# Patient Record
Sex: Male | Born: 1965 | Race: White | Hispanic: No | Marital: Married | Smoking: Never smoker
Health system: Southern US, Community
[De-identification: ages and names within clinical notes are randomized; demographics above are authoritative.]

## PROBLEM LIST (undated history)

## (undated) DIAGNOSIS — K219 Gastro-esophageal reflux disease without esophagitis: Secondary | ICD-10-CM

## (undated) DIAGNOSIS — E079 Disorder of thyroid, unspecified: Secondary | ICD-10-CM

## (undated) DIAGNOSIS — N200 Calculus of kidney: Secondary | ICD-10-CM

## (undated) DIAGNOSIS — I10 Essential (primary) hypertension: Secondary | ICD-10-CM

## (undated) DIAGNOSIS — IMO0002 Reserved for concepts with insufficient information to code with codable children: Secondary | ICD-10-CM

## (undated) DIAGNOSIS — Z87442 Personal history of urinary calculi: Secondary | ICD-10-CM

## (undated) DIAGNOSIS — Z789 Other specified health status: Secondary | ICD-10-CM

## (undated) DIAGNOSIS — K922 Gastrointestinal hemorrhage, unspecified: Secondary | ICD-10-CM

## (undated) HISTORY — DX: Disorder of thyroid, unspecified: E07.9

## (undated) HISTORY — DX: Calculus of kidney: N20.0

## (undated) HISTORY — PX: SHOULDER SURGERY: SHX246

## (undated) HISTORY — DX: Gastro-esophageal reflux disease without esophagitis: K21.9

## (undated) HISTORY — PX: OTHER SURGICAL HISTORY: SHX169

## (undated) HISTORY — PX: WISDOM TOOTH EXTRACTION: SHX21

## (undated) HISTORY — DX: Essential (primary) hypertension: I10

---

## 2013-08-03 ENCOUNTER — Other Ambulatory Visit: Payer: Self-pay | Admitting: Orthopedic Surgery

## 2013-08-03 DIAGNOSIS — M25511 Pain in right shoulder: Secondary | ICD-10-CM

## 2013-08-10 ENCOUNTER — Ambulatory Visit
Admission: RE | Admit: 2013-08-10 | Discharge: 2013-08-10 | Disposition: A | Discharge status: Home / Self Care | Payer: BC Managed Care – PPO | Source: Ambulatory Visit | Attending: Orthopedic Surgery | Admitting: Orthopedic Surgery

## 2013-08-10 DIAGNOSIS — M25511 Pain in right shoulder: Secondary | ICD-10-CM

## 2013-09-29 ENCOUNTER — Other Ambulatory Visit: Payer: Self-pay | Admitting: Orthopedic Surgery

## 2013-09-30 ENCOUNTER — Encounter (HOSPITAL_BASED_OUTPATIENT_CLINIC_OR_DEPARTMENT_OTHER): Payer: Self-pay | Admitting: *Deleted

## 2013-09-30 NOTE — Progress Notes (Signed)
No health problems No labs needed Never smoked

## 2013-10-07 NOTE — H&P (Signed)
Stephen Baker is an 47 y.o. male.   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  He presents for evaluation of a 12 month history of right shoulder pain.  His pain is perceived anteriorly near the subscapularis insertion and long head of biceps.  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  He had no history of fracture or dislocation to the right or left shoulders.  He reports that his right shoulder is weak in abduction, scaption and internal rotation.  He can lift overhead well.  He does not have chronic night pain. He denies numbness.  He has no symptoms on the left.  He has no neck discomfort.     Past Medical History  Diagnosis Date  . Medical history non-contributory   . Personal history of kidney stones     Past Surgical History  Procedure Laterality Date  . Wisdom tooth extraction      History reviewed. No pertinent family history. Social History:  reports that he has never smoked. He does not have any smokeless tobacco history on file. He reports that he does not drink alcohol or use illicit drugs.  Allergies: No Known Allergies  No prescriptions prior to admission    No results found for this or any previous visit (from the past 48 hour(s)).  No results found.   Pertinent items are noted in HPI.  Height 5\' 8"  (1.727 m), weight 76.204 kg (168 lb).  General appearance: alert Head: Normocephalic, without obvious abnormality Neck: supple, symmetrical, trachea midline Resp: clear to auscultation bilaterally Cardio: regular rate and rhythm GI: normal findings: bowel sounds normal Extremities: He has normal musculature. There is no atrophy of the periscapular muscles or deltoid.  He has range of motion elevation 170 symmetrical bilaterally abduction 90 degrees external rotation 90 right, 90 left.  Internal  rotation right thumb to T-12, left T-8.  He can extend his shoulder to the interscapular plane right and left.  He has tenderness on direct palpation over the long head of the biceps and lesser tuberosity on the right, negative on the left.  He has 5/5 strength in forward flexion, abduction and scaption until full internal rotation is applied at which point he has some measurable weakness graded 5-/5 on the right.  He has a painful push-off test on the right, negative on the left.  He has a painful Speed's test on the right.   Plain x-rays of his shoulder demonstrate degenerative change at the [facility] joint and minimal sclerosis of the greater tuberosity, glenohumeral joint is well preserved.   MRI of his right shoulder.  Stephen Baker has a significant posterior labral tear between 7 and 9 o'clock.  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  He has moderate AC joint arthritis and subacromial bursitis.     Pulses: 2+ and symmetric Skin: normal Neurologic: Grossly normal    Assessment/Plan Impression:  Right shoulder impingement with AC arthrosis and labral tear.  Plan:To the OR for right shoulder scope with SAD/DCR and repair labrum and RC as needed.The procedure, risks,benefits and post-op course were discussed with the patient at length and they were in agreement with the plan.  DASNOIT,Stephen Baker 10/07/2013, 3:13 PM  H&P documentation: 10/08/2013  -History and Physical Reviewed  -Patient has been re-examined  -No change in the plan of  care  Wyn Forster, MD

## 2013-10-08 ENCOUNTER — Encounter (HOSPITAL_BASED_OUTPATIENT_CLINIC_OR_DEPARTMENT_OTHER)
Admission: RE | Disposition: A | Discharge status: Home / Self Care | Payer: Self-pay | Source: Ambulatory Visit | Attending: Orthopedic Surgery

## 2013-10-08 ENCOUNTER — Encounter (HOSPITAL_BASED_OUTPATIENT_CLINIC_OR_DEPARTMENT_OTHER): Payer: Self-pay | Admitting: Orthopedic Surgery

## 2013-10-08 ENCOUNTER — Ambulatory Visit (HOSPITAL_BASED_OUTPATIENT_CLINIC_OR_DEPARTMENT_OTHER): Payer: BC Managed Care – PPO | Admitting: Anesthesiology

## 2013-10-08 ENCOUNTER — Encounter (HOSPITAL_BASED_OUTPATIENT_CLINIC_OR_DEPARTMENT_OTHER): Payer: BC Managed Care – PPO | Admitting: Anesthesiology

## 2013-10-08 ENCOUNTER — Ambulatory Visit (HOSPITAL_BASED_OUTPATIENT_CLINIC_OR_DEPARTMENT_OTHER)
Admission: RE | Admit: 2013-10-08 | Discharge: 2013-10-08 | Disposition: A | Discharge status: Home / Self Care | Payer: BC Managed Care – PPO | Source: Ambulatory Visit | Attending: Orthopedic Surgery | Admitting: Orthopedic Surgery

## 2013-10-08 DIAGNOSIS — M19019 Primary osteoarthritis, unspecified shoulder: Secondary | ICD-10-CM | POA: Insufficient documentation

## 2013-10-08 DIAGNOSIS — M25819 Other specified joint disorders, unspecified shoulder: Secondary | ICD-10-CM | POA: Insufficient documentation

## 2013-10-08 DIAGNOSIS — M24119 Other articular cartilage disorders, unspecified shoulder: Secondary | ICD-10-CM | POA: Insufficient documentation

## 2013-10-08 HISTORY — DX: Other specified health status: Z78.9

## 2013-10-08 HISTORY — PX: SHOULDER ARTHROSCOPY WITH ROTATOR CUFF REPAIR AND SUBACROMIAL DECOMPRESSION: SHX5686

## 2013-10-08 HISTORY — DX: Personal history of urinary calculi: Z87.442

## 2013-10-08 LAB — POCT HEMOGLOBIN-HEMACUE: Hemoglobin: 17 g/dL (ref 13.0–17.0)

## 2013-10-08 SURGERY — SHOULDER ARTHROSCOPY WITH ROTATOR CUFF REPAIR AND SUBACROMIAL DECOMPRESSION
Anesthesia: General | Site: Shoulder | Laterality: Right

## 2013-10-08 MED ORDER — LACTATED RINGERS IV SOLN
INTRAVENOUS | Status: DC
  Administered 2013-10-08: 09:00:00 via INTRAVENOUS

## 2013-10-08 MED ORDER — SUCCINYLCHOLINE CHLORIDE 20 MG/ML IJ SOLN
INTRAMUSCULAR | Status: DC | PRN
  Administered 2013-10-08: 100 mg via INTRAVENOUS

## 2013-10-08 MED ORDER — CHLORHEXIDINE GLUCONATE 4 % EX LIQD: 60.0000 mL | Freq: Once | CUTANEOUS | Status: DC

## 2013-10-08 MED ORDER — OXYCODONE HCL 5 MG/5ML PO SOLN: 5.0000 mg | Freq: Once | ORAL | Status: DC | PRN

## 2013-10-08 MED ORDER — HYDROMORPHONE HCL 2 MG PO TABS: 2.0000 mg | ORAL_TABLET | ORAL | Status: DC | PRN

## 2013-10-08 MED ORDER — FENTANYL CITRATE 0.05 MG/ML IJ SOLN
INTRAMUSCULAR | Status: AC
  Filled 2013-10-08: qty 4

## 2013-10-08 MED ORDER — PROPOFOL 10 MG/ML IV BOLUS
INTRAVENOUS | Status: DC | PRN
  Administered 2013-10-08: 200 mg via INTRAVENOUS

## 2013-10-08 MED ORDER — DEXAMETHASONE SODIUM PHOSPHATE 10 MG/ML IJ SOLN
INTRAMUSCULAR | Status: DC | PRN
  Administered 2013-10-08: 10 mg

## 2013-10-08 MED ORDER — OXYCODONE HCL 5 MG PO TABS: 5.0000 mg | ORAL_TABLET | Freq: Once | ORAL | Status: DC | PRN

## 2013-10-08 MED ORDER — ONDANSETRON HCL 4 MG/2ML IJ SOLN: 4.0000 mg | Freq: Once | INTRAMUSCULAR | Status: DC | PRN

## 2013-10-08 MED ORDER — CEFAZOLIN SODIUM 1-5 GM-% IV SOLN
INTRAVENOUS | Status: AC
Start: 2013-10-08 — End: 2013-10-08
  Filled 2013-10-08: qty 100

## 2013-10-08 MED ORDER — LACTATED RINGERS IV SOLN
INTRAVENOUS | Status: DC | PRN
  Administered 2013-10-08 (×2): via INTRAVENOUS

## 2013-10-08 MED ORDER — DEXAMETHASONE SODIUM PHOSPHATE 4 MG/ML IJ SOLN
INTRAMUSCULAR | Status: DC | PRN
  Administered 2013-10-08: 10 mg via INTRAVENOUS

## 2013-10-08 MED ORDER — FENTANYL CITRATE 0.05 MG/ML IJ SOLN
50.0000 ug | INTRAMUSCULAR | Status: DC | PRN
  Administered 2013-10-08: 100 ug via INTRAVENOUS

## 2013-10-08 MED ORDER — FENTANYL CITRATE 0.05 MG/ML IJ SOLN
INTRAMUSCULAR | Status: AC
  Filled 2013-10-08: qty 2

## 2013-10-08 MED ORDER — HYDROMORPHONE HCL PF 1 MG/ML IJ SOLN: 0.2500 mg | INTRAMUSCULAR | Status: DC | PRN

## 2013-10-08 MED ORDER — ROPIVACAINE HCL 5 MG/ML IJ SOLN
INTRAMUSCULAR | Status: DC | PRN
  Administered 2013-10-08: 25 mL via PERINEURAL

## 2013-10-08 MED ORDER — CEPHALEXIN 500 MG PO CAPS: 500.0000 mg | ORAL_CAPSULE | Freq: Three times a day (TID) | ORAL | Status: DC

## 2013-10-08 MED ORDER — MIDAZOLAM HCL 2 MG/2ML IJ SOLN
INTRAMUSCULAR | Status: AC
  Filled 2013-10-08: qty 2

## 2013-10-08 MED ORDER — CEFAZOLIN SODIUM-DEXTROSE 2-3 GM-% IV SOLR
INTRAVENOUS | Status: DC | PRN
  Administered 2013-10-08: 2 g via INTRAVENOUS

## 2013-10-08 MED ORDER — SODIUM CHLORIDE 0.9 % IR SOLN
Status: DC | PRN
  Administered 2013-10-08: 8000 mL

## 2013-10-08 MED ORDER — EPINEPHRINE HCL 1 MG/ML IJ SOLN
INTRAMUSCULAR | Status: DC | PRN
  Administered 2013-10-08: .15 mL via SUBCUTANEOUS

## 2013-10-08 MED ORDER — MIDAZOLAM HCL 2 MG/2ML IJ SOLN
1.0000 mg | INTRAMUSCULAR | Status: DC | PRN
  Administered 2013-10-08: 2 mg via INTRAVENOUS

## 2013-10-08 SURGICAL SUPPLY — 77 items
BANDAGE ADHESIVE 1X3 (GAUZE/BANDAGES/DRESSINGS) IMPLANT
BLADE AVERAGE 25X9 (BLADE) IMPLANT
BLADE CUTTER MENIS 5.5 (BLADE) ×2 IMPLANT
BLADE SURG 15 STRL LF DISP TIS (BLADE) ×2 IMPLANT
BLADE SURG 15 STRL SS (BLADE) ×2
BUR EGG 3PK/BX (BURR) IMPLANT
BUR OVAL 6.0 (BURR) ×2 IMPLANT
CANISTER SUCT 3000ML (MISCELLANEOUS) IMPLANT
CANISTER SUCT LVC 12 LTR MEDI- (MISCELLANEOUS) ×4 IMPLANT
CANNULA TWIST IN 8.25X7CM (CANNULA) ×2 IMPLANT
CLEANER CAUTERY TIP 5X5 PAD (MISCELLANEOUS) IMPLANT
CUTTER MENISCUS  4.2MM (BLADE) ×1
CUTTER MENISCUS 4.2MM (BLADE) ×1 IMPLANT
DECANTER SPIKE VIAL GLASS SM (MISCELLANEOUS) IMPLANT
DRAPE INCISE IOBAN 66X45 STRL (DRAPES) ×2 IMPLANT
DRAPE STERI 35X30 U-POUCH (DRAPES) ×2 IMPLANT
DRAPE SURG 17X23 STRL (DRAPES) ×2 IMPLANT
DRAPE U-SHAPE 47X51 STRL (DRAPES) ×2 IMPLANT
DRAPE U-SHAPE 76X120 STRL (DRAPES) ×4 IMPLANT
DURAPREP 26ML APPLICATOR (WOUND CARE) ×2 IMPLANT
ELECT REM PT RETURN 9FT ADLT (ELECTROSURGICAL)
ELECTRODE REM PT RTRN 9FT ADLT (ELECTROSURGICAL) IMPLANT
GLOVE BIOGEL M STRL SZ7.5 (GLOVE) ×2 IMPLANT
GLOVE BIOGEL PI IND STRL 7.0 (GLOVE) ×1 IMPLANT
GLOVE BIOGEL PI IND STRL 8 (GLOVE) ×2 IMPLANT
GLOVE BIOGEL PI INDICATOR 7.0 (GLOVE) ×1
GLOVE BIOGEL PI INDICATOR 8 (GLOVE) ×2
GLOVE ECLIPSE 6.5 STRL STRAW (GLOVE) ×2 IMPLANT
GLOVE EXAM NITRILE LRG STRL (GLOVE) ×2 IMPLANT
GLOVE ORTHO TXT STRL SZ7.5 (GLOVE) ×2 IMPLANT
GOWN BRE IMP PREV XXLGXLNG (GOWN DISPOSABLE) ×6 IMPLANT
GOWN STRL REIN 2XL XLG LVL4 (GOWN DISPOSABLE) ×2 IMPLANT
NDL SUT 6 .5 CRC .975X.05 MAYO (NEEDLE) IMPLANT
NEEDLE MAYO TAPER (NEEDLE)
NEEDLE MINI RC 24MM (NEEDLE) IMPLANT
NEEDLE SCORPION (NEEDLE) ×2 IMPLANT
PACK ARTHROSCOPY DSU (CUSTOM PROCEDURE TRAY) ×2 IMPLANT
PACK BASIN DAY SURGERY FS (CUSTOM PROCEDURE TRAY) ×2 IMPLANT
PAD ABD 8X10 STRL (GAUZE/BANDAGES/DRESSINGS) ×2 IMPLANT
PAD CLEANER CAUTERY TIP 5X5 (MISCELLANEOUS)
PASSER SUT SWANSON 36MM LOOP (INSTRUMENTS) IMPLANT
PENCIL BUTTON HOLSTER BLD 10FT (ELECTRODE) IMPLANT
SLEEVE SCD COMPRESS KNEE MED (MISCELLANEOUS) ×2 IMPLANT
SLING ARM FOAM STRAP LRG (SOFTGOODS) IMPLANT
SLING ARM FOAM STRAP MED (SOFTGOODS) IMPLANT
SLING ARM FOAM STRAP XLG (SOFTGOODS) ×2 IMPLANT
SPONGE GAUZE 4X4 12PLY (GAUZE/BANDAGES/DRESSINGS) ×2 IMPLANT
SPONGE LAP 4X18 X RAY DECT (DISPOSABLE) IMPLANT
STRIP CLOSURE SKIN 1/2X4 (GAUZE/BANDAGES/DRESSINGS) ×2 IMPLANT
SUCTION FRAZIER TIP 10 FR DISP (SUCTIONS) IMPLANT
SUT ETHIBOND 2 OS 4 DA (SUTURE) IMPLANT
SUT ETHILON 4 0 PS 2 18 (SUTURE) IMPLANT
SUT FIBERWIRE #2 38 T-5 BLUE (SUTURE)
SUT FIBERWIRE 3-0 18 TAPR NDL (SUTURE)
SUT PROLENE 1 CT (SUTURE) IMPLANT
SUT PROLENE 3 0 PS 2 (SUTURE) ×2 IMPLANT
SUT TIGER TAPE 7 IN WHITE (SUTURE) IMPLANT
SUT VIC AB 0 CT1 27 (SUTURE)
SUT VIC AB 0 CT1 27XBRD ANBCTR (SUTURE) IMPLANT
SUT VIC AB 0 SH 27 (SUTURE) IMPLANT
SUT VIC AB 2-0 SH 27 (SUTURE)
SUT VIC AB 2-0 SH 27XBRD (SUTURE) IMPLANT
SUT VIC AB 3-0 SH 27 (SUTURE)
SUT VIC AB 3-0 SH 27X BRD (SUTURE) IMPLANT
SUT VIC AB 3-0 X1 27 (SUTURE) IMPLANT
SUTURE FIBERWR #2 38 T-5 BLUE (SUTURE) IMPLANT
SUTURE FIBERWR 3-0 18 TAPR NDL (SUTURE) IMPLANT
SYR 3ML 23GX1 SAFETY (SYRINGE) IMPLANT
SYR BULB 3OZ (MISCELLANEOUS) IMPLANT
TAPE FIBER 2MM 7IN #2 BLUE (SUTURE) IMPLANT
TAPE PAPER 3X10 WHT MICROPORE (GAUZE/BANDAGES/DRESSINGS) ×2 IMPLANT
TOWEL OR 17X24 6PK STRL BLUE (TOWEL DISPOSABLE) ×2 IMPLANT
TUBE CONNECTING 20X1/4 (TUBING) ×4 IMPLANT
TUBING ARTHROSCOPY IRRIG 16FT (MISCELLANEOUS) ×2 IMPLANT
WAND STAR VAC 90 (SURGICAL WAND) ×2 IMPLANT
WATER STERILE IRR 1000ML POUR (IV SOLUTION) ×2 IMPLANT
YANKAUER SUCT BULB TIP NO VENT (SUCTIONS) IMPLANT

## 2013-10-08 NOTE — Op Note (Signed)
[id number]  

## 2013-10-08 NOTE — Anesthesia Postprocedure Evaluation (Signed)
  Anesthesia Post-op Note  Patient: Stephen Baker  Procedure(s) Performed: Procedure(s): RIGHT SHOULDER ARTHROSCOPY WITH SUBACROMIAL DECOMPRESSION/DISTAL CLAVICLE RESECTION  (Right)  Patient Location: PACU  Anesthesia Type:GA combined with regional for post-op pain  Level of Consciousness: awake, alert  and oriented  Airway and Oxygen Therapy: Patient Spontanous Breathing and Patient connected to face mask oxygen  Post-op Pain: none  Post-op Assessment: Post-op Vital signs reviewed  Post-op Vital Signs: Reviewed  Complications: No apparent anesthesia complications

## 2013-10-08 NOTE — Anesthesia Procedure Notes (Signed)
Anesthesia Regional Block:  Interscalene brachial plexus block  Pre-Anesthetic Checklist: ,, timeout performed, Correct Patient, Correct Site, Correct Laterality, Correct Procedure, Correct Position, site marked, Risks and benefits discussed,  Surgical consent,  Pre-op evaluation,  At surgeon's request and post-op pain management  Laterality: Right and Upper  Prep: chloraprep       Needles:  Injection technique: Single-shot  Needle Type: Echogenic Needle     Needle Length: 5cm 5 cm Needle Gauge: 21 and 21 G    Additional Needles:  Procedures: ultrasound guided (picture in chart) Interscalene brachial plexus block Narrative:  Start time: 10/08/2013 9:47 AM End time: 10/08/2013 9:45 AM Injection made incrementally with aspirations every 5 mL.  Performed by: Personally  Anesthesiologist: Sheldon Silvan, MD

## 2013-10-08 NOTE — Transfer of Care (Signed)
Immediate Anesthesia Transfer of Care Note  Patient: Stephen Baker  Procedure(s) Performed: Procedure(s): RIGHT SHOULDER ARTHROSCOPY WITH SUBACROMIAL DECOMPRESSION/DISTAL CLAVICLE RESECTION  (Right)  Patient Location: PACU  Anesthesia Type:General and GA combined with regional for post-op pain  Level of Consciousness: awake  Airway & Oxygen Therapy: Patient Spontanous Breathing and Patient connected to face mask oxygen  Post-op Assessment: Report given to PACU RN and Post -op Vital signs reviewed and stable  Post vital signs: Reviewed and stable  Complications: No apparent anesthesia complications

## 2013-10-08 NOTE — Brief Op Note (Signed)
10/08/2013  11:25 AM  PATIENT:  Stephen Baker  47 y.o. male  PRE-OPERATIVE DIAGNOSIS:  RIGHT SHOULDER IMPINGEMENT WITH AC ARTHROSIS, POSTERIOR GLAD LESION  POST-OPERATIVE DIAGNOSIS:  RIGHT SHOULDER IMPINGEMENT WITH ACRMIOCLAVICULAR ARTHROSIS, POSTERIOR GLAD LESION  PROCEDURE:  Procedure(s): RIGHT SHOULDER ARTHROSCOPY WITH SUBACROMIAL DECOMPRESSION/DISTAL CLAVICLE RESECTION AND OPEN ROTATOR CUFF REPAIR AS NEEDED (Right)  SURGEON:  Surgeon(s) and Role:    * Wyn Forster., MD - Primary  PHYSICIAN ASSISTANT:   ASSISTANTS: Mallory Shirk.A-C   ANESTHESIA:   general  EBL:  Total I/O In: 1000 [I.V.:1000] Out: -   BLOOD ADMINISTERED:none  DRAINS: none   LOCAL MEDICATIONS USED:  ROPIVACAINE PLEXUS BLOCK  SPECIMEN:  No Specimen  DISPOSITION OF SPECIMEN:  N/A  COUNTS:  YES  TOURNIQUET:  * No tourniquets in log *  DICTATION: .Other Dictation: Dictation Number [id number]  PLAN OF CARE: Discharge to home after PACU  PATIENT DISPOSITION:  PACU - hemodynamically stable.   Delay start of Pharmacological VTE agent (>24hrs) due to surgical blood loss or risk of bleeding: not applicable

## 2013-10-08 NOTE — Anesthesia Preprocedure Evaluation (Addendum)

## 2013-10-08 NOTE — Progress Notes (Signed)
Assisted Dr. Crews with right, ultrasound guided, supraclavicular block. Side rails up, monitors on throughout procedure. See vital signs in flow sheet. Tolerated Procedure well. 

## 2013-10-09 NOTE — Op Note (Signed)
Stephen Baker, Stephen Baker                ACCOUNT NO.:  [id number]  MEDICAL RECORD NO.:  [id number]  LOCATION:                                 FACILITY:  PHYSICIAN:  Katy Fitch. Rivan Siordia, M.D.      DATE OF BIRTH:  DATE OF PROCEDURE:  10/08/2013 DATE OF DISCHARGE:                              OPERATIVE REPORT   PREOPERATIVE DIAGNOSES:  MRI-documented posterior glenolabral articular disruption, hyaline cartilage and labral injury with unfavorable acromioclavicular joint morphology and MRI-documented acromioclavicular joint arthritis.  POSTOPERATIVE DIAGNOSES:  Confirmation of posterior glenolabral articular disruption lesion, followed by labral debridement and abrasion chondroplasty of posterior-inferior glenoid, and confirmation of sub- acromioclavicular joint impingement with bursitis and bursal hypertrophy.  OPERATIONS: 1. Diagnostic arthroscopy, right glenohumeral joint, confirming the     glenolabral articular disruption lesion with both posterior and     anterior arthroscopy of right glenohumeral joint. 2. Arthroscopic debridement of labrum and abrasion chondroplasty of 8     o'clock position posterior glenolabral articular disruption lesion. 3. Arthroscopic subacromial decompression with bursectomy,     coracoacromial ligament release, and anteromedial acromioplasty. 4. Arthroscopic resection of distal clavicle.  OPERATING SURGEON:  Katy Fitch. Jehan Ranganathan, MD  ASSISTANT:  Marveen Reeks Dasnoit, PA  ANESTHESIA:  General by endotracheal technique.  SUPERVISING ANESTHESIOLOGIST:  Sheldon Silvan, MD.  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 Burnell Blanks, of [place], [place], for management of right shoulder pain.  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  He had numerous injuries to his shoulder on the right and falls off rodeo animals.  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  During the past year, he has had progressive right shoulder pain.  He was noticing weakness of abduction, external rotation, and perceived this pain anteriorly.  He had night pain.  He was referred by Dr. Nathanial Rancher for an upper extremity orthopedic evaluation and was noted to have signs of internal derangement of the shoulder and a possible rotator cuff impairment.  He was referred for an MRI of the shoulder, which documented AC arthropathy, unfavorable morphology of the distal clavicle, and a posterior GLAD type lesion between 7 and 9 o'clock, consistent with prior trauma, likely from a rodeo fall.  Due to failure to respond to nonoperative measures, we recommended that Stephen Baker schedule diagnostic arthroscopy of the shoulder to further elucidate the nature of his glenoid and labral pathology, and consider subacromial decompression.  He understands that typically GLAD lesions do not lead to instability, however, there is injury to the hyaline cartilage which is addressed in most individuals with abrasion chondroplasty and/or microfracture.  Our goal will be to debride any unstable labrum to prevent hyaline cartilage injury to the humeral head.  After detailed informed consent, he was brought to the operating room at this time.  DESCRIPTION OF PROCEDURE:  Stephen Baker was interviewed in the holding area, and questions regarding his anticipated procedure were invited and answered in detail.  We advised him that we are anticipating diagnostic arthroscopy of the shoulder to further evaluate his GLAD lesion followed  by appropriate debridement, either chondroplasty or microfracture.  We also advised him that we would be examining the distal clavicle, AC joint, and likely performing subacromial decompression with distal clavicle resection.  Questions regarding the procedure were invited and answered with Stephen Baker wife present.  Dr. Ivin Booty provided detailed  anesthesia informed consent.  General anesthesia by endotracheal technique was recommended and accepted by Stephen Baker.  He was then transferred to room 2 of the [facility], placed in supine position on the operating table.  Under Dr. Ivin Booty' direct supervision, general endotracheal anesthesia was induced followed by routine positioning in the beach-chair position with the aid of a torso and head holder designed for shoulder arthroscopy.  Passive compression devices were applied to the cast for deep vein thrombosis prevention and all bony prominences were carefully padded. Care was taken to be sure that there were no tight belts around his midsection or legs.  The entire right upper extremity and forequarter were prepped with DuraPrep and draped with impervious arthroscopy drapes.  A routine surgical time-out was accomplished and confirmation of IV prophylactic antibiotics noted.  The procedure commenced with placement of the arthroscope through a standard posterior viewing portal with anterior switching stick technique.  Diagnostic arthroscopy immediately revealed labral degenerative changes posteriorly and fragmented cartilage at the 8 o'clock position.  This was best visualized from the anterior portal. We carefully inspected the subscapularis, anterior glenohumeral ligaments, the long head of the biceps, the superior labrum, anterior labrum, inferior labrum, and noted degenerative changes at 5:30, 6 o'clock, and marked degeneration at 7, 8, and 9 o'clock posteriorly. The humeral head had intact hyaline cartilage.  The rotator cuff insertion appeared to be sound.  The long head of the biceps was normal through the rotator interval into the intertubercular groove.  We then placed a clear cannula in the anterior superior portal and placed the scope anteriorly.  We placed a cannula in the posterior viewing portal and used the scope to clearly elucidate the GLAD  lesion.  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  This was smoothed with a 4.2-mm suction shaver to a smooth margin.  The area was small enough that I did not feel a formal microfracture was indicated, therefore we performed an abrasion chondroplasty to a stable margin of hyaline cartilage and a stable margin of labrum.  A nerve hook was used to palpate the labral margin and it was found to be stable once debrided.  There did not appear to be signs of chronic posterior instability.  This appeared to be prior injury type scenario.  After a thorough debridement was accomplished, a final picture was taken, documenting the status of the debrided glenoid.  We then proceeded to remove the scope from the glenohumeral joint and placed the scope in subacromial space of the posterior portal.  Florid bursitis was noted.  A posterolateral port was created and the bursa debrided.  The coracoacromial ligament was inspected and found to be quite prominent anteriorly.  This was released with the cutting cautery and hemostasis was achieved with bipolar cautery.  After bursectomy, we noted the distal clavicle was very prominent.  After removal of the capsule with the cutting cautery, the distal cm clavicle was removed arthroscopically with hemostasis with bipolar cautery.  The medial, anterior, and anterolateral acromion was leveled to a type 1 morphology with a suction shaver and burr.  Hemostasis was achieved with the bipolar cautery. After careful  inspection of the cuff, no sign of a full-thickness cuff tear was noted.  There was clear evidence of anterior bursitis and abrasion beneath the coracoacromial ligament.  Documentary images of the decompression were accomplished with the digital camera followed by removal of the arthroscopic equipment.  The portals were repaired with intradermal 3-0 Prolene and Steri-Strips. Mr. Tillman was  placed in a compressive dressing with paper tape and placed in a sling.  He will be discharged home to the care of his family and instructed to return to our office within 24 hours for dressing change and initiation of his postoperative exercise program.  He was provided prescriptions for Dilaudid 2 mg 1 p.o. q.4-6 hours p.r.n. pain, 30 tablets without refill, also Keflex 500 mg 1 p.o. q.8 hours x4 days as a prophylactic antibiotic.     Katy Fitch Bryttani Blew, M.D.     RVS/MEDQ  D:  10/08/2013  T:  10/09/2013  Job:  [id number]

## 2013-10-12 ENCOUNTER — Encounter (HOSPITAL_BASED_OUTPATIENT_CLINIC_OR_DEPARTMENT_OTHER): Payer: Self-pay | Admitting: Orthopedic Surgery

## 2016-09-06 ENCOUNTER — Inpatient Hospital Stay
Admission: EM | Admit: 2016-09-06 | Discharge: 2016-09-09 | DRG: 378 | Disposition: A | Discharge status: Home / Self Care | Payer: BLUE CROSS/BLUE SHIELD | Attending: Internal Medicine | Admitting: Internal Medicine

## 2016-09-06 ENCOUNTER — Encounter: Payer: Self-pay | Admitting: *Deleted

## 2016-09-06 DIAGNOSIS — D62 Acute posthemorrhagic anemia: Secondary | ICD-10-CM | POA: Diagnosis present

## 2016-09-06 DIAGNOSIS — R55 Syncope and collapse: Secondary | ICD-10-CM | POA: Diagnosis not present

## 2016-09-06 DIAGNOSIS — E669 Obesity, unspecified: Secondary | ICD-10-CM | POA: Diagnosis present

## 2016-09-06 DIAGNOSIS — K2980 Duodenitis without bleeding: Secondary | ICD-10-CM | POA: Diagnosis present

## 2016-09-06 DIAGNOSIS — K264 Chronic or unspecified duodenal ulcer with hemorrhage: Secondary | ICD-10-CM | POA: Diagnosis not present

## 2016-09-06 DIAGNOSIS — Z6825 Body mass index (BMI) 25.0-25.9, adult: Secondary | ICD-10-CM

## 2016-09-06 DIAGNOSIS — K922 Gastrointestinal hemorrhage, unspecified: Secondary | ICD-10-CM | POA: Diagnosis present

## 2016-09-06 DIAGNOSIS — Z87442 Personal history of urinary calculi: Secondary | ICD-10-CM

## 2016-09-06 HISTORY — DX: Reserved for concepts with insufficient information to code with codable children: IMO0002

## 2016-09-06 HISTORY — DX: Gastrointestinal hemorrhage, unspecified: K92.2

## 2016-09-06 HISTORY — DX: Disorder of thyroid, unspecified: E07.9

## 2016-09-06 LAB — BASIC METABOLIC PANEL
Anion gap: 8 (ref 5–15)
BUN: 35 mg/dL — AB (ref 6–20)
CALCIUM: 8.2 mg/dL — AB (ref 8.9–10.3)
CO2: 27 mmol/L (ref 22–32)
Chloride: 104 mmol/L (ref 101–111)
Creatinine, Ser: 1.11 mg/dL (ref 0.61–1.24)
GFR calc Af Amer: >60 mL/min (ref >60)
Glucose, Bld: 154 mg/dL — ABNORMAL HIGH (ref 65–99)
Potassium: 3.7 mmol/L (ref 3.5–5.1)
SODIUM: 139 mmol/L (ref 135–145)

## 2016-09-06 LAB — CBC
HCT: 30.1 % — ABNORMAL LOW (ref 40.0–52.0)
Hemoglobin: 10.7 g/dL — ABNORMAL LOW (ref 13.0–18.0)
MCH: 32.2 pg (ref 26.0–34.0)
MCHC: 35.4 g/dL (ref 32.0–36.0)
MCV: 90.8 fL (ref 80.0–100.0)
PLATELETS: 254 10*3/uL (ref 150–440)
RBC: 3.32 MIL/uL — ABNORMAL LOW (ref 4.40–5.90)
RDW: 12.1 % (ref 11.5–14.5)
WBC: 8.8 10*3/uL (ref 3.8–10.6)

## 2016-09-06 LAB — URINE DRUG SCREEN, QUALITATIVE (ARMC ONLY)
Amphetamines, Ur Screen: NOT DETECTED
BARBITURATES, UR SCREEN: NOT DETECTED
BENZODIAZEPINE, UR SCRN: NOT DETECTED
Cannabinoid 50 Ng, Ur ~~LOC~~: NOT DETECTED
Cocaine Metabolite,Ur ~~LOC~~: NOT DETECTED
MDMA (Ecstasy)Ur Screen: NOT DETECTED
Methadone Scn, Ur: NOT DETECTED
OPIATE, UR SCREEN: NOT DETECTED
Phencyclidine (PCP) Ur S: NOT DETECTED
Tricyclic, Ur Screen: NOT DETECTED

## 2016-09-06 LAB — URINALYSIS COMPLETE WITH MICROSCOPIC (ARMC ONLY)
BACTERIA UA: NONE SEEN
BILIRUBIN URINE: NEGATIVE
Glucose, UA: NEGATIVE mg/dL
Hgb urine dipstick: NEGATIVE
Ketones, ur: NEGATIVE mg/dL
Leukocytes, UA: NEGATIVE
NITRITE: NEGATIVE
PROTEIN: NEGATIVE mg/dL
Specific Gravity, Urine: 1.016 (ref 1.005–1.030)
pH: 7 (ref 5.0–8.0)

## 2016-09-06 LAB — GLUCOSE, CAPILLARY: Glucose-Capillary: 136 mg/dL — ABNORMAL HIGH (ref 65–99)

## 2016-09-06 LAB — TYPE AND SCREEN
ABO/RH(D): O POS
ANTIBODY SCREEN: NEGATIVE

## 2016-09-06 LAB — ETHANOL

## 2016-09-06 LAB — TROPONIN I: Troponin I: <0.03 ng/mL (ref <0.03)

## 2016-09-06 MED ORDER — PANTOPRAZOLE SODIUM 40 MG IV SOLR
80.0000 mg | Freq: Once | INTRAVENOUS | Status: AC
  Administered 2016-09-07: 80 mg via INTRAVENOUS
  Filled 2016-09-06: qty 80

## 2016-09-06 MED ORDER — SODIUM CHLORIDE 0.9 % IV SOLN
8.0000 mg/h | INTRAVENOUS | Status: DC
  Administered 2016-09-07 – 2016-09-08 (×4): 8 mg/h via INTRAVENOUS
  Filled 2016-09-06 (×4): qty 80

## 2016-09-06 MED ORDER — SODIUM CHLORIDE 0.9 % IV BOLUS (SEPSIS)
1000.0000 mL | Freq: Once | INTRAVENOUS | Status: AC
  Administered 2016-09-06: 1000 mL via INTRAVENOUS

## 2016-09-06 NOTE — ED Notes (Signed)
Pt reports having "spit up" earlier today after passing out for first time at home.  Pt reports that it looked like coffee grounds, but that it was not a lot.  Pt also reports that he has had black stools that are formed up until today.  Pt reports that today's BM was brown and formed.  Pt states he has a history of bleeding ulcer 15-20 years ago.  Pt states that he was recently traveling to [place] and back, but states that symptoms started before trip.

## 2016-09-06 NOTE — ED Triage Notes (Signed)
Pt reports passing out tonight while getting out of the shower.  Pt states he became hot, clammy and pale.  Pt had n/v episode after passing out.  No chest pain or sob.  Pt alert.

## 2016-09-06 NOTE — ED Provider Notes (Signed)
[facility] Emergency Department Provider Note   First MD Initiated Contact with Patient 09/06/16 2330     (approximate)  I have reviewed the triage vital signs and the nursing notes.   HISTORY  Chief Complaint Loss of Consciousness   HPI Stephen Baker is a 50 y.o. male history of previous bleeding gastric ulcer many years ago presents to the emergency department with one-week history of dark stools epigastric discomfort (none at present) 6 nausea and vomiting tonight followed by syncopal episode. Patient states that his emesis was consistent with coffee grounds. Patient states that he started to feel lightheaded while in shower and subsequently called his wife laid down on the bathroom floor and then lost consciousness. Patient denies any chest pain or shortness of breath.   Past Medical History:  Diagnosis Date  . GI bleed   . Medical history non-contributory   . Personal history of kidney stones   . Thyroid disease    hyper  . Ulcer ([place])    almost 20 years ago    Patient Active Problem List   Diagnosis Date Noted  . GI bleed 09/07/2016  . Syncope 09/07/2016    Past Surgical History:  Procedure Laterality Date  . OTHER SURGICAL HISTORY    . SHOULDER ARTHROSCOPY WITH ROTATOR CUFF REPAIR AND SUBACROMIAL DECOMPRESSION Right 10/08/2013   Procedure: RIGHT SHOULDER ARTHROSCOPY WITH SUBACROMIAL DECOMPRESSION/DISTAL CLAVICLE RESECTION ;  Surgeon: Cammie Sickle., MD;  Location: [place];  Service: Orthopedics;  Laterality: Right;  . WISDOM TOOTH EXTRACTION      Prior to Admission medications   Medication Sig Start Date End Date Taking? Authorizing Provider  aspirin EC 81 MG tablet Take 81 mg by mouth daily.   Yes Historical Provider, MD  cetirizine (ZYRTEC) 10 MG tablet Take 10 mg by mouth daily as needed for allergies.   Yes Historical Provider, MD  cephALEXin (KEFLEX) 500 MG capsule Take 1 capsule (500 mg total) by mouth 3  (three) times daily. Patient not taking: Reported on 09/06/2016 10/08/13   Theodis Sato, MD  HYDROmorphone (DILAUDID) 2 MG tablet Take 1 tablet (2 mg total) by mouth every 4 (four) hours as needed for severe pain. Patient not taking: Reported on 09/06/2016 10/08/13   Theodis Sato, MD    Allergies No known drug allergies Family History  Problem Relation Age of Onset  . Diabetes Mother   . Heart disease Paternal Grandfather     Social History Social History  Substance Use Topics  . Smoking status: Never Smoker  . Smokeless tobacco: Never Used  . Alcohol use No    Review of Systems Constitutional: No fever/chills Eyes: No visual changes. ENT: No sore throat. Cardiovascular: Denies chest pain. Respiratory: Denies shortness of breath. Gastrointestinal: No abdominal pain.  Positive coffee ground emesis and dark stools.  Genitourinary: Negative for dysuria. Musculoskeletal: Negative for back pain. Skin: Negative for rash. Neurological: Negative for headaches, focal weakness or numbness.  10-point ROS otherwise negative.  ____________________________________________   PHYSICAL EXAM:  VITAL SIGNS: ED Triage Vitals [09/06/16 2108]  Enc Vitals Group     BP 111/76     Pulse Rate 93     Resp 20     Temp 98.4 F (36.9 C)     Temp Source Oral     SpO2 99 %     Weight 165 lb (74.8 kg)     Height 5\' 9"  (1.753 m)     Head Circumference  Peak Flow      Pain Score      Pain Loc      Pain Edu?      Excl. in [place]?     Constitutional: Alert and oriented. Well appearing and in no acute distress. Eyes: Conjunctivae are normal. PERRL. EOMI. Head: Atraumatic. Ears:  Healthy appearing ear canals and TMs bilaterally Nose: No congestion/rhinnorhea. Mouth/Throat: Mucous membranes are moist.  Oropharynx non-erythematous. Neck: No stridor.  .  No cervical spine tenderness to palpation. Cardiovascular: Normal rate, regular rhythm. Good peripheral circulation. Grossly normal heart  sounds. Respiratory: Normal respiratory effort.  No retractions. Lungs CTAB. Gastrointestinal: Soft and nontender. No distention.  Musculoskeletal: No lower extremity tenderness nor edema. No gross deformities of extremities. Neurologic:  Normal speech and language. No gross focal neurologic deficits are appreciated.  Skin:  Skin is warm, dry and intact. No rash noted. Psychiatric: Mood and affect are normal. Speech and behavior are normal.  ____________________________________________   LABS (all labs ordered are listed, but only abnormal results are displayed)  Labs Reviewed  BASIC METABOLIC PANEL - Abnormal; Notable for the following:       Result Value   Glucose, Bld 154 (*)    BUN 35 (*)    Calcium 8.2 (*)    All other components within normal limits  CBC - Abnormal; Notable for the following:    RBC 3.32 (*)    Hemoglobin 10.7 (*)    HCT 30.1 (*)    All other components within normal limits  URINALYSIS COMPLETEWITH MICROSCOPIC ([facility] ONLY) - Abnormal; Notable for the following:    Color, Urine STRAW (*)    APPearance CLEAR (*)    Squamous Epithelial / LPF 0-5 (*)    All other components within normal limits  GLUCOSE, CAPILLARY - Abnormal; Notable for the following:    Glucose-Capillary 136 (*)    All other components within normal limits  HEMOGLOBIN AND HEMATOCRIT, BLOOD - Abnormal; Notable for the following:    Hemoglobin 9.1 (*)    HCT 26.2 (*)    All other components within normal limits  HEMOGLOBIN - Abnormal; Notable for the following:    Hemoglobin 10.0 (*)    All other components within normal limits  TROPONIN I  URINE DRUG SCREEN, QUALITATIVE ([facility] ONLY)  ETHANOL  TROPONIN I  HEMOGLOBIN  HEMOGLOBIN  BASIC METABOLIC PANEL  CBC  TROPONIN I  TROPONIN I  CBG MONITORING, ED  TYPE AND SCREEN   ____________________________________________  EKG  ED ECG REPORT I, Blencoe N Ephram Kornegay, the attending physician, personally viewed and interpreted this ECG.    Date: 09/06/2016  EKG Time: 9:07 PM  Rate: 97  Rhythm: Normal sinus rhythm  Axis: Normal  Intervals: Normal  ST&T Change: None    Procedures   Critical Care performed:CRITICAL CARE Performed by: Gregor Hams   Total critical care time: 30 minutes  Critical care time was exclusive of separately billable procedures and treating other patients.  Critical care was necessary to treat or prevent imminent or life-threatening deterioration.  Critical care was time spent personally by me on the following activities: development of treatment plan with patient and/or surrogate as well as nursing, discussions with consultants, evaluation of patient's response to treatment, examination of patient, obtaining history from patient or surrogate, ordering and performing treatments and interventions, ordering and review of laboratory studies, ordering and review of radiographic studies, pulse oximetry and re-evaluation of patient's condition.  ____________   INITIAL IMPRESSION / ASSESSMENT AND  PLAN / ED COURSE  Pertinent labs & imaging results that were available during my care of the patient were reviewed by me and considered in my medical decision making (see chart for details).  History physical exam concerning for possible bleeding gastric ulcer. Patient given Protonix 80 mg bolus followed by infusion. Patient discussed with Dr. Jannifer Franklin for hospital admission for further evaluation and management   Clinical Course    ____________________________________________  FINAL CLINICAL IMPRESSION(S) / ED DIAGNOSES  Gastrointestinal bleeding.   MEDICATIONS GIVEN DURING THIS VISIT:  Medications  pantoprazole (PROTONIX) 80 mg in sodium chloride 0.9 % 250 mL (0.32 mg/mL) infusion (8 mg/hr Intravenous New Bag/Given 09/07/16 0100)  sodium chloride flush (NS) 0.9 % injection 3 mL (3 mLs Intravenous Given 09/07/16 0244)  0.9 %  sodium chloride infusion ( Intravenous New Bag/Given 09/07/16 0244)    acetaminophen (TYLENOL) tablet 650 mg (not administered)    Or  acetaminophen (TYLENOL) suppository 650 mg (not administered)  ondansetron (ZOFRAN) tablet 4 mg (not administered)    Or  ondansetron (ZOFRAN) injection 4 mg (not administered)  pantoprazole (PROTONIX) 80 mg in sodium chloride 0.9 % 100 mL IVPB (0 mg Intravenous Stopped 09/07/16 0058)  sodium chloride 0.9 % bolus 1,000 mL (0 mLs Intravenous Stopped 09/07/16 0034)  sodium chloride 0.9 % bolus 1,000 mL (0 mLs Intravenous Stopped 09/07/16 0034)     NEW OUTPATIENT MEDICATIONS STARTED DURING THIS VISIT:  Current Discharge Medication List      Current Discharge Medication List      Current Discharge Medication List       Note:  This document was prepared using Dragon voice recognition software and may include unintentional dictation errors.    Gregor Hams, MD 09/07/16 [phone]

## 2016-09-06 NOTE — ED Notes (Addendum)
Wife called nurse over to report pt feels like passing out; pt pale, diaphoretic, unable to obtain BP; syncopal episode witnessed lasting approx 15sec; charge nurse called; pt taken immed to room 9 via w/c and placed on card monitor; BP 82/43; wife reports pt with hx bleeding ulcers requiring ICU admission; IVFs hung to infuse WO; care assumed by Corky Sing, RN

## 2016-09-07 ENCOUNTER — Encounter: Payer: Self-pay | Admitting: Internal Medicine

## 2016-09-07 ENCOUNTER — Inpatient Hospital Stay: Payer: BLUE CROSS/BLUE SHIELD | Admitting: Anesthesiology

## 2016-09-07 ENCOUNTER — Encounter
Admission: EM | Disposition: A | Discharge status: Home / Self Care | Payer: Self-pay | Source: Home / Self Care | Attending: Internal Medicine

## 2016-09-07 ENCOUNTER — Inpatient Hospital Stay (HOSPITAL_COMMUNITY)
Admit: 2016-09-07 | Discharge: 2016-09-07 | Disposition: A | Discharge status: Home / Self Care | Payer: BLUE CROSS/BLUE SHIELD | Attending: Internal Medicine | Admitting: Internal Medicine

## 2016-09-07 DIAGNOSIS — K2980 Duodenitis without bleeding: Secondary | ICD-10-CM | POA: Diagnosis present

## 2016-09-07 DIAGNOSIS — R55 Syncope and collapse: Secondary | ICD-10-CM

## 2016-09-07 DIAGNOSIS — E669 Obesity, unspecified: Secondary | ICD-10-CM | POA: Diagnosis present

## 2016-09-07 DIAGNOSIS — K922 Gastrointestinal hemorrhage, unspecified: Secondary | ICD-10-CM | POA: Diagnosis present

## 2016-09-07 DIAGNOSIS — K264 Chronic or unspecified duodenal ulcer with hemorrhage: Secondary | ICD-10-CM | POA: Diagnosis present

## 2016-09-07 DIAGNOSIS — Z87442 Personal history of urinary calculi: Secondary | ICD-10-CM | POA: Diagnosis not present

## 2016-09-07 DIAGNOSIS — Z6825 Body mass index (BMI) 25.0-25.9, adult: Secondary | ICD-10-CM | POA: Diagnosis not present

## 2016-09-07 DIAGNOSIS — D62 Acute posthemorrhagic anemia: Secondary | ICD-10-CM | POA: Diagnosis present

## 2016-09-07 HISTORY — PX: ESOPHAGOGASTRODUODENOSCOPY (EGD) WITH PROPOFOL: SHX5813

## 2016-09-07 LAB — ECHOCARDIOGRAM COMPLETE
HEIGHTINCHES: 69 in
Weight: 2752 oz

## 2016-09-07 LAB — HEMOGLOBIN AND HEMATOCRIT, BLOOD
HCT: 26.2 % — ABNORMAL LOW (ref 40.0–52.0)
HEMOGLOBIN: 9.1 g/dL — AB (ref 13.0–18.0)

## 2016-09-07 LAB — TROPONIN I
Troponin I: <0.03 ng/mL (ref <0.03)
Troponin I: <0.03 ng/mL (ref <0.03)

## 2016-09-07 LAB — BASIC METABOLIC PANEL
ANION GAP: 3 — AB (ref 5–15)
BUN: 25 mg/dL — ABNORMAL HIGH (ref 6–20)
CALCIUM: 7.9 mg/dL — AB (ref 8.9–10.3)
CHLORIDE: 110 mmol/L (ref 101–111)
CO2: 24 mmol/L (ref 22–32)
Creatinine, Ser: 0.96 mg/dL (ref 0.61–1.24)
GFR calc Af Amer: >60 mL/min (ref >60)
GFR calc non Af Amer: >60 mL/min (ref >60)
GLUCOSE: 106 mg/dL — AB (ref 65–99)
Potassium: 3.9 mmol/L (ref 3.5–5.1)
Sodium: 137 mmol/L (ref 135–145)

## 2016-09-07 LAB — CBC
HEMATOCRIT: 26.2 % — AB (ref 40.0–52.0)
HEMOGLOBIN: 8.9 g/dL — AB (ref 13.0–18.0)
MCH: 31.5 pg (ref 26.0–34.0)
MCHC: 34 g/dL (ref 32.0–36.0)
MCV: 92.6 fL (ref 80.0–100.0)
Platelets: 213 10*3/uL (ref 150–440)
RBC: 2.84 MIL/uL — ABNORMAL LOW (ref 4.40–5.90)
RDW: 12.3 % (ref 11.5–14.5)
WBC: 7.4 10*3/uL (ref 3.8–10.6)

## 2016-09-07 LAB — HEMOGLOBIN
HEMOGLOBIN: 9.4 g/dL — AB (ref 13.0–18.0)
Hemoglobin: 10 g/dL — ABNORMAL LOW (ref 13.0–18.0)
Hemoglobin: 9.2 g/dL — ABNORMAL LOW (ref 13.0–18.0)

## 2016-09-07 SURGERY — ESOPHAGOGASTRODUODENOSCOPY (EGD) WITH PROPOFOL
Anesthesia: General

## 2016-09-07 MED ORDER — PROPOFOL 500 MG/50ML IV EMUL
INTRAVENOUS | Status: DC | PRN
  Administered 2016-09-07: 120 ug/kg/min via INTRAVENOUS

## 2016-09-07 MED ORDER — MIDAZOLAM HCL 5 MG/5ML IJ SOLN
INTRAMUSCULAR | Status: DC | PRN
  Administered 2016-09-07: 1 mg via INTRAVENOUS

## 2016-09-07 MED ORDER — GLYCOPYRROLATE 0.2 MG/ML IJ SOLN
INTRAMUSCULAR | Status: DC | PRN
  Administered 2016-09-07: 0.2 mg via INTRAVENOUS

## 2016-09-07 MED ORDER — ACETAMINOPHEN 650 MG RE SUPP: 650.0000 mg | Freq: Four times a day (QID) | RECTAL | Status: DC | PRN

## 2016-09-07 MED ORDER — FENTANYL CITRATE (PF) 100 MCG/2ML IJ SOLN
INTRAMUSCULAR | Status: DC | PRN
  Administered 2016-09-07: 50 ug via INTRAVENOUS

## 2016-09-07 MED ORDER — SODIUM CHLORIDE 0.9 % IV SOLN
INTRAVENOUS | Status: DC
  Administered 2016-09-07: 1000 mL via INTRAVENOUS

## 2016-09-07 MED ORDER — SODIUM CHLORIDE 0.9 % IV SOLN
INTRAVENOUS | Status: DC
  Administered 2016-09-07: 03:00:00 via INTRAVENOUS

## 2016-09-07 MED ORDER — ACETAMINOPHEN 325 MG PO TABS
650.0000 mg | ORAL_TABLET | Freq: Four times a day (QID) | ORAL | Status: DC | PRN
  Administered 2016-09-07 (×2): 650 mg via ORAL
  Filled 2016-09-07 (×2): qty 2

## 2016-09-07 MED ORDER — ONDANSETRON HCL 4 MG/2ML IJ SOLN: 4.0000 mg | Freq: Four times a day (QID) | INTRAMUSCULAR | Status: DC | PRN

## 2016-09-07 MED ORDER — LIDOCAINE 2% (20 MG/ML) 5 ML SYRINGE
INTRAMUSCULAR | Status: DC | PRN
  Administered 2016-09-07: 50 mg via INTRAVENOUS

## 2016-09-07 MED ORDER — ONDANSETRON HCL 4 MG PO TABS: 4.0000 mg | ORAL_TABLET | Freq: Four times a day (QID) | ORAL | Status: DC | PRN

## 2016-09-07 MED ORDER — SODIUM CHLORIDE 0.9% FLUSH
3.0000 mL | Freq: Two times a day (BID) | INTRAVENOUS | Status: DC
  Administered 2016-09-07 – 2016-09-09 (×5): 3 mL via INTRAVENOUS

## 2016-09-07 MED ORDER — PROPOFOL 10 MG/ML IV BOLUS
INTRAVENOUS | Status: DC | PRN
  Administered 2016-09-07: 70 mg via INTRAVENOUS

## 2016-09-07 NOTE — Consult Note (Signed)
GI Inpatient Consult Note  Reason for Consult: Melena, anemia, syncope   Attending Requesting Consult: Dr. Jannifer Baker  History of Present Illness: Stephen Baker is a 50 y.o. male with a known history of hyperthyroidism, nephrolithiasis, and gastric ulcers (approximately 20 years ago) admitted with anemia and melena following an episode of syncope.  Patient reported intermittent epigastric pain and "dark stools" over the last week.  Last evening, he began experiencing nausea followed by vomiting, with emesis described as "coffee grounds".  After vomiting, patient began feeling lightheaded while in the shower and subsequently experienced an episode of syncope and loss of consciousness.  He then presented to the [facility] ED for further evaluation.  Upon arrival, patient was noted to appear pale and continued to feel diaphoretic.  The triage nurse reported difficulty obtaining a BP.  He experienced a witnessed syncopal episode, lasting approximately 15 seconds.  BP was then obtained at 82/43.  IV fluids were initiated, and patient began to improve symptomatically.  In the ED, labs demonstrated Hgb 10.7, HCT 30.1, BUN 35.  She was admitted for further evaluation and management, including initiation of IV Protonix drip in GI consultation.  Serial Hgb obtained this morning reveal Hgb 9.1.  Patient states he was in his usual state of health when the aforementioned symptoms began.  The epigastric discomfort was intermittent and mild, and stools appeared more "dark" than black.  Therefore, he did not think much of his symptoms until syncope occurred.  Patient states he experienced similar symptoms when he was diagnosed with a bleeding ulcer about 20 years ago; he required admission to the ICU at that time due to significant anemia. He endorses an increase in stress recently.  He notes infrequent Excedrin and BC powder use.  Also no significant EtOH or tobacco use.  No history of H. pylori infection per patient.  No  family history of colon cancer, colon polyps, or other GI malignancy.  An EGD was performed during his last admission with an ulcer, an has never had a colonoscopy.  She unexplained weight loss, appetite changes, dysphagia, reflux symptoms, change in bowel habits, frank blood in stool, or melena.  Past Medical History:  Past Medical History:  Diagnosis Date  . GI bleed   . Medical history non-contributory   . Personal history of kidney stones   . Thyroid disease    hyper  . Ulcer ([place])    almost 20 years ago    Problem List: Patient Active Problem List   Diagnosis Date Noted  . GI bleed 09/07/2016  . Syncope 09/07/2016    Past Surgical History: Past Surgical History:  Procedure Laterality Date  . OTHER SURGICAL HISTORY    . SHOULDER ARTHROSCOPY WITH ROTATOR CUFF REPAIR AND SUBACROMIAL DECOMPRESSION Right 10/08/2013   Procedure: RIGHT SHOULDER ARTHROSCOPY WITH SUBACROMIAL DECOMPRESSION/DISTAL CLAVICLE RESECTION ;  Surgeon: Cammie Sickle., MD;  Location: [place];  Service: Orthopedics;  Laterality: Right;  . WISDOM TOOTH EXTRACTION      Allergies: No Known Allergies  Home Medications: Prescriptions Prior to Admission  Medication Sig Dispense Refill Last Dose  . aspirin EC 81 MG tablet Take 81 mg by mouth daily.   09/06/2016 at Unknown time  . cetirizine (ZYRTEC) 10 MG tablet Take 10 mg by mouth daily as needed for allergies.   prn  . cephALEXin (KEFLEX) 500 MG capsule Take 1 capsule (500 mg total) by mouth 3 (three) times daily. (Patient not taking: Reported on 09/06/2016) 12 capsule 0  Not Taking at Unknown time  . HYDROmorphone (DILAUDID) 2 MG tablet Take 1 tablet (2 mg total) by mouth every 4 (four) hours as needed for severe pain. (Patient not taking: Reported on 09/06/2016) 30 tablet 0 Not Taking at Unknown time   Home medication reconciliation was completed with the patient.   Scheduled Inpatient Medications:   . sodium chloride flush  3 mL Intravenous  Q12H    Continuous Inpatient Infusions:   . sodium chloride 100 mL/hr at 09/07/16 0244  . pantoprozole (PROTONIX) infusion 8 mg/hr (09/07/16 0100)    PRN Inpatient Medications:  acetaminophen **OR** acetaminophen, ondansetron **OR** ondansetron (ZOFRAN) IV  Family History: family history includes Diabetes in his mother; Heart disease in his paternal grandfather.    Social History:   reports that he has never smoked. He has never used smokeless tobacco. He reports that he does not drink alcohol or use drugs.   Review of Systems: Constitutional: Weight is stable.  Eyes: No changes in vision. ENT: No oral lesions, sore throat.  GI: see HPI.  Heme/Lymph: No easy bruising.  CV: No chest pain.  GU: No hematuria.  Integumentary: No rashes.  Neuro: No headaches.  Psych: No depression/anxiety.  Endocrine: No heat/cold intolerance.  Allergic/Immunologic: No urticaria.  Resp: No cough, SOB.  Musculoskeletal: No joint swelling.    Physical Examination: BP 127/89 (BP Location: Left Arm)   Pulse 88   Temp 98.6 F (37 C) (Oral)   Resp 19   Ht 5\' 9"  (1.753 m)   Wt 78 kg (172 lb)   SpO2 100%   BMI 25.40 kg/m  Gen: NAD, alert and oriented x 4 HEENT: PEERLA, EOMI, Neck: supple, no JVD or thyromegaly Chest: CTA bilaterally, no wheezes, crackles, or other adventitious sounds CV: RRR, no m/g/c/r Abd: soft, NT, ND, +BS in all four quadrants; no HSM, guarding, ridigity, or rebound tenderness Ext: no edema, well perfused with 2+ pulses, Skin: no rash or lesions noted Lymph: no LAD  Data: Lab Results  Component Value Date   WBC 7.4 09/07/2016   HGB 8.9 (L) 09/07/2016   HCT 26.2 (L) 09/07/2016   MCV 92.6 09/07/2016   PLT 213 09/07/2016    Recent Labs Lab 09/07/16 0032 09/07/16 0259 09/07/16 0800  HGB 9.1* 10.0* 8.9*   Lab Results  Component Value Date   NA 137 09/07/2016   K 3.9 09/07/2016   CL 110 09/07/2016   CO2 24 09/07/2016   BUN 25 (H) 09/07/2016   CREATININE  0.96 09/07/2016   No results found for: ALT, AST, GGT, ALKPHOS, BILITOT No results for input(s): APTT, INR, PTT in the last 168 hours.   Assessment/Plan: Mr. Stephen Baker is a 50 y.o. male  with a known history of hyperthyroidism, nephrolithiasis, and gastric ulcers (approximately 20 years ago) admitted with anemia and melena following an episode of syncope.  He reported intermittent epigastric discomfort and dark stools over the last week, with nausea and coffee-ground emesis beginning last night.  Patient subsequently experienced 2 episodes of syncope, and was quite hypertensive on arrival to the ED.  Since arrival, Hgb has decreased from 10.7 > 9.1.  BUN also elevated on admission at 35.  IV Protonix drip was initated on admission.  Patient endorses infrequent BC powder use, but stress has increased recently.  He also relates a history of bleeding ulcer about 20 years ago, and reports these symptoms feel similar.  I suspect upper GI bleeding is likely due to another ulcer and/or gastritis, therefore  an EGD is recommended for direct luminal evaluation.  Per Dr. Vira Agar, EGD is planned for this afternoon pending patient remains stable.  Recommendations: - Plan for EGD this afternoon per Dr. Vira Agar - Remain NPO - Continue IV Protonix drip until EGD - Continue monitoring serial Hgb, transfuse if <7 - Further recs per Dr. Vira Agar pending procedure results  Thank you for the consult. We will follow along with you. Please call with questions or concerns.  Lavera Guise, PA-C [facility] Phone: [phone] Pager: [phone]

## 2016-09-07 NOTE — Anesthesia Postprocedure Evaluation (Signed)
Anesthesia Post Note  Patient: Stephen Baker  Procedure(s) Performed: Procedure(s) (LRB): ESOPHAGOGASTRODUODENOSCOPY (EGD) WITH PROPOFOL (N/A)  Patient location during evaluation: Endoscopy Anesthesia Type: General Level of consciousness: awake and alert Pain management: pain level controlled Vital Signs Assessment: post-procedure vital signs reviewed and stable Respiratory status: spontaneous breathing and respiratory function stable Cardiovascular status: stable Anesthetic complications: no    Last Vitals:  Vitals:   09/07/16 1605 09/07/16 1615  BP: (!) 95/51 97/62  Pulse: 92 93  Resp: 18 (!) 24  Temp: 36.3 C     Last Pain:  Vitals:   09/07/16 1605  TempSrc: Tympanic  PainSc:                  Nely Dedmon K

## 2016-09-07 NOTE — Plan of Care (Signed)
Problem: Education: Goal: Knowledge of [address] [facility] Education information/materials will improve Outcome: Progressing Pt likes to be called Stephen Baker  Past Medical History:  Diagnosis Date  . Medical history non-contributory   . Personal history of kidney stones    Pt is well controlled with home medications

## 2016-09-07 NOTE — Anesthesia Preprocedure Evaluation (Signed)
Anesthesia Evaluation  Patient identified by MRN, date of birth, ID band Patient awake    Reviewed: Allergy & Precautions, NPO status , Patient's Chart, lab work & pertinent test results  Airway Mallampati: II       Dental  (+) Teeth Intact   Pulmonary neg pulmonary ROS,    breath sounds clear to auscultation       Cardiovascular Exercise Tolerance: Good  Rhythm:Regular Rate:Normal     Neuro/Psych negative neurological ROS     GI/Hepatic negative GI ROS, Neg liver ROS,   Endo/Other    Renal/GU negative Renal ROS     Musculoskeletal   Abdominal (+) + obese,   Peds negative pediatric ROS (+)  Hematology  (+) anemia ,   Anesthesia Other Findings   Reproductive/Obstetrics                             Anesthesia Physical Anesthesia Plan  ASA: II  Anesthesia Plan: General   Post-op Pain Management:    Induction: Intravenous  Airway Management Planned: Natural Airway and Nasal Cannula  Additional Equipment:   Intra-op Plan:   Post-operative Plan:   Informed Consent: I have reviewed the patients History and Physical, chart, labs and discussed the procedure including the risks, benefits and alternatives for the proposed anesthesia with the patient or authorized representative who has indicated his/her understanding and acceptance.     Plan Discussed with: CRNA  Anesthesia Plan Comments:         Anesthesia Quick Evaluation

## 2016-09-07 NOTE — Progress Notes (Signed)
*  PRELIMINARY RESULTS* Echocardiogram 2D Echocardiogram has been performed.  Stephen Baker 09/07/2016, 10:52 AM

## 2016-09-07 NOTE — Progress Notes (Signed)
Patient ID: Stephen Baker, male   DOB: Mar 26, 1966, 50 y.o.   MRN: [id number]  [employer] PROGRESS NOTE  Stephen Baker [id number] DOB: April 12, 1966 DOA: 09/06/2016 PCP: Cyndi Bender, PA-C  HPI/Subjective: Patient had black stools twice a day for about a week. Yesterday was in the shower and had a passing out episode and then vomited a little coffee ground emesis. In the emergency room waiting area he had another passout episode. Patient currently feels okay without any abdominal pain. No nausea or vomiting. Patient has been taking BC powder and takes aspirin at home.  Objective: Vitals:   09/07/16 1351 09/07/16 1446  BP: 125/75 129/85  Pulse: 87 91  Resp: 20 20  Temp: 98.4 F (36.9 C) 97.5 F (36.4 C)    Filed Weights   09/06/16 2108 09/07/16 0214  Weight: 74.8 kg (165 lb) 78 kg (172 lb)    ROS: Review of Systems  Constitutional: Negative for chills and fever.  Eyes: Negative for blurred vision.  Respiratory: Negative for cough and shortness of breath.   Cardiovascular: Negative for chest pain.  Gastrointestinal: Positive for melena. Negative for abdominal pain, constipation, diarrhea, nausea and vomiting.  Genitourinary: Negative for dysuria.  Musculoskeletal: Negative for joint pain.  Neurological: Negative for dizziness and headaches.   Exam: Physical Exam  HENT:  Nose: No mucosal edema.  Mouth/Throat: No oropharyngeal exudate or posterior oropharyngeal edema.  Eyes: Conjunctivae, EOM and lids are normal. Pupils are equal, round, and reactive to light.  Neck: No JVD present. Carotid bruit is not present. No edema present. No thyroid mass and no thyromegaly present.  Cardiovascular: S1 normal and S2 normal.  Exam reveals no gallop.   No murmur heard. Pulses:      Dorsalis pedis pulses are 2+ on the right side, and 2+ on the left side.  Respiratory: No respiratory distress. He has no wheezes. He has no rhonchi. He has no rales.  GI: Soft. Bowel sounds are  normal. There is no tenderness.  Musculoskeletal:       Right shoulder: He exhibits no swelling.  Lymphadenopathy:    He has no cervical adenopathy.  Neurological: He is alert. No cranial nerve deficit.  Skin: Skin is warm. No rash noted. Nails show no clubbing.  Psychiatric: He has a normal mood and affect.      Data Reviewed: Basic Metabolic Panel:  Recent Labs Lab 09/06/16 2111 09/07/16 0800  NA 139 137  K 3.7 3.9  CL 104 110  CO2 27 24  GLUCOSE 154* 106*  BUN 35* 25*  CREATININE 1.11 0.96  CALCIUM 8.2* 7.9*   CBC:  Recent Labs Lab 09/06/16 2111 09/07/16 0032 09/07/16 0259 09/07/16 0800 09/07/16 1414  WBC 8.8  --   --  7.4  --   HGB 10.7* 9.1* 10.0* 8.9* 9.4*  HCT 30.1* 26.2*  --  26.2*  --   MCV 90.8  --   --  92.6  --   PLT 254  --   --  213  --    Cardiac Enzymes:  Recent Labs Lab 09/06/16 2111 09/07/16 0259 09/07/16 0800 09/07/16 1414  TROPONINI <0.03 <0.03 <0.03 <0.03    CBG:  Recent Labs Lab 09/06/16 2116  GLUCAP 136*     Scheduled Meds: . [MAR Hold] sodium chloride flush  3 mL Intravenous Q12H   Continuous Infusions: . sodium chloride 1,000 mL (09/07/16 1452)  . pantoprozole (PROTONIX) infusion 8 mg/hr (09/07/16 1126)    Assessment/Plan:  1.  Acute hemorrhagic anemia, upper GI bleed with melena. Patient currently down for endoscopy. Patient on Protonix drip. Advised he must stop aspirin and BC powder. Depending on what showing on endoscopy will depend on when he is discharged. Serial hemoglobins. Transfuse if needed. Patient has had history of ulcer in the past 2. Syncope secondary to acute blood loss anemia  Code Status:     Code Status Orders        Start     Ordered   09/07/16 0216  Full code  Continuous     09/07/16 0215    Code Status History    Date Active Date Inactive Code Status Order ID Comments User Context   This patient has a current code status but no historical code status.    Advance Directive  Documentation   Flowsheet Row Most Recent Value  Type of Advance Directive  Healthcare Power of Attorney  Pre-existing out of facility DNR order (yellow form or pink MOST form)  No data  "MOST" Form in Place?  No data     Family Communication: Family at bedside Disposition Plan: Potentially home tomorrow depending on endoscopy results  Consultants:  Gastroenterology  Time spent: 35 minutes  [place], [place]

## 2016-09-07 NOTE — Consult Note (Signed)
Patient EGD showed ulceration, inflammation and narrowing of duodenal area between bulb and second portion. No clot and no active bleeding, no blood. Recommend clear liquids, iv PPI for another 1-2 days, avoid all NSAID meds, then full liquids for a few days.

## 2016-09-07 NOTE — H&P (Signed)
[facility] [place] NAME: Stephen Baker    MR#:  [id number]  DATE OF BIRTH:  08-Apr-1966  DATE OF ADMISSION:  09/06/2016  PRIMARY CARE PHYSICIAN: Cyndi Bender, PA-C   REQUESTING/REFERRING PHYSICIAN: Owens Shark, MD  CHIEF COMPLAINT:   Chief Complaint  Patient presents with  . Loss of Consciousness    HISTORY OF PRESENT ILLNESS:  Stephen Baker  is a 50 y.o. male who presents with 2 episodes of syncope and an episode of coffee-ground emesis. Patient also states that he had dark stools last week. He has had a bleeding gastric ulcer in the remote past, and had similar symptoms at that time. 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 He is hemodynamically stable in the ED with a hemoglobin of 10.7, with no recent prior lab for good comparison. He denies any abdominal pain. Hospitals were called for admission and further evaluation  PAST MEDICAL HISTORY:   Past Medical History:  Diagnosis Date  . Medical history non-contributory   . Personal history of kidney stones     PAST SURGICAL HISTORY:   Past Surgical History:  Procedure Laterality Date  . SHOULDER ARTHROSCOPY WITH ROTATOR CUFF REPAIR AND SUBACROMIAL DECOMPRESSION Right 10/08/2013   Procedure: RIGHT SHOULDER ARTHROSCOPY WITH SUBACROMIAL DECOMPRESSION/DISTAL CLAVICLE RESECTION ;  Surgeon: Cammie Sickle., MD;  Location: [facility];  Service: Orthopedics;  Laterality: Right;  . WISDOM TOOTH EXTRACTION      SOCIAL HISTORY:   Social History  Substance Use Topics  . Smoking status: Never Smoker  . Smokeless tobacco: Never Used  . Alcohol use No    FAMILY HISTORY:  No family history on file.  DRUG ALLERGIES:  No Known Allergies  MEDICATIONS AT HOME:   Prior to Admission medications   Medication Sig Start Date  End Date Taking? Authorizing Provider  cetirizine (ZYRTEC) 10 MG tablet Take 10 mg by mouth daily as needed for allergies.   Yes Historical Provider, MD  cephALEXin (KEFLEX) 500 MG capsule Take 1 capsule (500 mg total) by mouth 3 (three) times daily. Patient not taking: Reported on 09/06/2016 10/08/13   Theodis Sato, MD  HYDROmorphone (DILAUDID) 2 MG tablet Take 1 tablet (2 mg total) by mouth every 4 (four) hours as needed for severe pain. Patient not taking: Reported on 09/06/2016 10/08/13   Theodis Sato, MD    REVIEW OF SYSTEMS:  Review of Systems  Constitutional: Negative for chills, fever, malaise/fatigue and weight loss.  HENT: Negative for ear pain, hearing loss and tinnitus.   Eyes: Negative for blurred vision, double vision, pain and redness.  Respiratory: Negative for cough, hemoptysis and shortness of breath.   Cardiovascular: Negative for chest pain, palpitations, orthopnea and leg swelling.  Gastrointestinal: Positive for melena and vomiting. Negative for abdominal pain, constipation, diarrhea and nausea.  Genitourinary: Negative for dysuria, frequency and hematuria.  Musculoskeletal: Negative for back pain, joint pain and neck pain.  Skin:       No acne, rash, or lesions  Neurological: Positive for loss of consciousness. Negative for dizziness, tremors, focal weakness and weakness.  Endo/Heme/Allergies: Negative for polydipsia. Does not bruise/bleed easily.  Psychiatric/Behavioral: Negative for depression. The patient is not nervous/anxious and does not have insomnia.      VITAL SIGNS:   Vitals:   09/06/16 2108  09/06/16 2154 09/06/16 2201 09/07/16 0015  BP: 111/76 105/71 106/73 124/74  Pulse: 93 78 86 84  Resp: 20 12 17 13   Temp: 98.4 F (36.9 C)   98.1 F (36.7 C)  TempSrc: Oral   Oral  SpO2: 99% 100% 97% 98%  Weight: 74.8 kg (165 lb)     Height: 5\' 9"  (1.753 m)      Wt Readings from Last 3 Encounters:  09/06/16 74.8 kg (165 lb)  10/08/13 82.4 kg (181 lb 9.6 oz)     PHYSICAL EXAMINATION:  Physical Exam  Vitals reviewed. Constitutional: He is oriented to person, place, and time. He appears well-developed and well-nourished. No distress.  HENT:  Head: Normocephalic and atraumatic.  Mouth/Throat: Oropharynx is clear and moist.  Eyes: Conjunctivae and EOM are normal. Pupils are equal, round, and reactive to light. No scleral icterus.  Neck: Normal range of motion. Neck supple. No JVD present. No thyromegaly present.  Cardiovascular: Normal rate, regular rhythm and intact distal pulses.  Exam reveals no gallop and no friction rub.   No murmur heard. Respiratory: Effort normal and breath sounds normal. No respiratory distress. He has no wheezes. He has no rales.  GI: Soft. Bowel sounds are normal. He exhibits no distension. There is no tenderness.  Musculoskeletal: Normal range of motion. He exhibits no edema.  No arthritis, no gout  Lymphadenopathy:    He has no cervical adenopathy.  Neurological: He is alert and oriented to person, place, and time. No cranial nerve deficit.  No dysarthria, no aphasia  Skin: Skin is warm and dry. No rash noted. No erythema.  Psychiatric: He has a normal mood and affect. His behavior is normal. Judgment and thought content normal.    LABORATORY PANEL:   CBC  Recent Labs Lab 09/06/16 2111  WBC 8.8  HGB 10.7*  HCT 30.1*  PLT 254   ------------------------------------------------------------------------------------------------------------------  Chemistries   Recent Labs Lab 09/06/16 2111  NA 139  K 3.7  CL 104  CO2 27  GLUCOSE 154*  BUN 35*  CREATININE 1.11  CALCIUM 8.2*   ------------------------------------------------------------------------------------------------------------------  Cardiac Enzymes  Recent Labs Lab 09/06/16 2111  TROPONINI <0.03   ------------------------------------------------------------------------------------------------------------------  RADIOLOGY:  No  results found.  EKG:   Orders placed or performed during the hospital encounter of 09/06/16  . EKG 12-Lead  . EKG 12-Lead  . ED EKG  . ED EKG    IMPRESSION AND PLAN:  Principal Problem:   GI bleed - IV protonic drip, nothing by mouth for now, GI consult, serial hemoglobin checks Active Problems:   Syncope - trend cardiac enzymes tonight, get an echocardiogram in the morning.  All the records are reviewed and case discussed with ED provider. Management plans discussed with the patient and/or family.  DVT PROPHYLAXIS: Mechanical only  GI PROPHYLAXIS: PPI  ADMISSION STATUS: Inpatient  CODE STATUS: Full Code Status History    This patient does not have a recorded code status. Please follow your organizational policy for patients in this situation.      TOTAL TIME TAKING CARE OF THIS PATIENT: 45 minutes.    Keymora Grillot [place] 09/07/2016, 12:50 AM  [facility]  Office  [phone]  CC: Primary care physician; Cyndi Bender, PA-C

## 2016-09-07 NOTE — Progress Notes (Signed)
Pt transported back from Endo. Pt resting comfortably in bed with family at bedside. Placed clear liquid diet per Dr. Vira Agar.

## 2016-09-07 NOTE — Op Note (Signed)
[facility] Gastroenterology Patient Name: Stephen Baker Procedure Date: 09/07/2016 3:39 PM MRN: [id number] Account #: [id number] Date of Birth: 08/23/1966 Admit Type: Inpatient Age: 50 Room: [facility] ENDO ROOM 4 Gender: Male Note Status: Finalized Procedure:            Upper GI endoscopy Indications:          Melena, Recent gastrointestinal bleeding, Suspected                        upper gastrointestinal bleeding Providers:            Manya Silvas, MD Referring MD:         [name] (Referring MD) Medicines:            Propofol per Anesthesia Complications:        No immediate complications. Procedure:            Pre-Anesthesia Assessment:                       - After reviewing the risks and benefits, the patient                        was deemed in satisfactory condition to undergo the                        procedure.                       After obtaining informed consent, the endoscope was                        passed under direct vision. Throughout the procedure,                        the patient's blood pressure, pulse, and oxygen                        saturations were monitored continuously. The Endoscope                        was introduced through the mouth, and advanced to the                        second part of duodenum. The upper GI endoscopy was                        accomplished without difficulty. The patient tolerated                        the procedure well. Findings:      No blood anywhere in esophagus, stomach, duodenum.      LA Grade A (one or more mucosal breaks less than 5 mm, not extending       between tops of 2 mucosal folds) esophagitis with no bleeding was found       40 cm from the incisors.      A few dispersed, small non-bleeding erosions were found in the gastric       body. There were no stigmata of recent bleeding.      One non-bleeding cratered duodenal ulcer with no stigmata of bleeding  was found in the  duodenal bulb. The lesion was 3 mm in largest dimension.      Diffuse severe inflammation characterized by erosions, erythema and       granularity was found in the duodenal bulb. Impression:           - LA Grade A reflux esophagitis.                       - Non-bleeding erosive gastropathy.                       - One non-bleeding duodenal ulcer with no stigmata of                        bleeding.                       - Duodenitis.                       - No specimens collected. Recommendation:       - The findings and recommendations were discussed with                        the patient's family. Clear liquid diet, continue PPI,                        advance in 2 days to full liquids. Monitor hgb. Manya Silvas, MD 09/07/2016 4:09:25 PM This report has been signed electronically. Number of Addenda: 0 Note Initiated On: 09/07/2016 3:39 PM      [facility]

## 2016-09-07 NOTE — Transfer of Care (Signed)
Immediate Anesthesia Transfer of Care Note  Patient: Stephen Baker  Procedure(s) Performed: Procedure(s): ESOPHAGOGASTRODUODENOSCOPY (EGD) WITH PROPOFOL (N/A)  Patient Location: Endoscopy Unit  Anesthesia Type:General  Level of Consciousness: awake  Airway & Oxygen Therapy: Patient Spontanous Breathing and Patient connected to nasal cannula oxygen  Post-op Assessment: Report given to RN and Post -op Vital signs reviewed and stable  Post vital signs: Reviewed  Last Vitals:  Vitals:   09/07/16 1446 09/07/16 1605  BP: 129/85 (!) 95/51  Pulse: 91 92  Resp: 20 18  Temp: 36.4 C 36.3 C    Last Pain:  Vitals:   09/07/16 1446  TempSrc: Tympanic  PainSc:          Complications: No apparent anesthesia complications

## 2016-09-07 NOTE — Progress Notes (Signed)
Pt transported to Endo for EGD.  Wife at bedside and will go to Endo with pt.

## 2016-09-08 LAB — HEMOGLOBIN: Hemoglobin: 8.8 g/dL — ABNORMAL LOW (ref 13.0–18.0)

## 2016-09-08 LAB — CBC
HCT: 25.8 % — ABNORMAL LOW (ref 40.0–52.0)
HEMOGLOBIN: 9.2 g/dL — AB (ref 13.0–18.0)
MCH: 32.3 pg (ref 26.0–34.0)
MCHC: 35.7 g/dL (ref 32.0–36.0)
MCV: 90.4 fL (ref 80.0–100.0)
PLATELETS: 217 10*3/uL (ref 150–440)
RBC: 2.86 MIL/uL — AB (ref 4.40–5.90)
RDW: 12.4 % (ref 11.5–14.5)
WBC: 7 10*3/uL (ref 3.8–10.6)

## 2016-09-08 MED ORDER — PANTOPRAZOLE SODIUM 40 MG PO TBEC
40.0000 mg | DELAYED_RELEASE_TABLET | Freq: Two times a day (BID) | ORAL | Status: DC
  Administered 2016-09-08 – 2016-09-09 (×3): 40 mg via ORAL
  Filled 2016-09-08 (×3): qty 1

## 2016-09-08 NOTE — Progress Notes (Signed)
GI Inpatient Follow-up Note  Patient Identification: Stephen Baker is a 50 y.o. male admitted for melena, syncope. Had EGD yesterday-showed duodenal ulceration and duodenitis. He has tolerated clear liquid diet overnight and for breakfast. No further n/v/abd pain. No BM overnight. Wife at bedside. No concerns or complaints.   Subjective:  Scheduled Inpatient Medications:  . pantoprazole  40 mg Oral BID AC  . sodium chloride flush  3 mL Intravenous Q12H    Continuous Inpatient Infusions:     PRN Inpatient Medications:  acetaminophen **OR** acetaminophen, ondansetron **OR** ondansetron (ZOFRAN) IV    Physical Examination: BP 122/77   Pulse 84   Temp 98.5 F (36.9 C) (Oral)   Resp 18   Ht 5\' 9"  (1.753 m)   Wt 78 kg (172 lb)   SpO2 97%   BMI 25.40 kg/m  Gen: NAD, alert and oriented x 4 HEENT: PEERLA, EOMI, Neck: supple, no JVD or thyromegaly Chest: CTA bilaterally, no wheezes, crackles, or other adventitious sounds CV: RRR, no m/g/c/r Abd: soft, NT, ND, +BS in all four quadrants; no HSM, guarding, ridigity, or rebound tenderness Ext: no edema, well perfused with 2+ pulses, Skin: no rash or lesions noted Lymph: no LAD  Data: Lab Results  Component Value Date   WBC 7.0 09/08/2016   HGB 9.2 (L) 09/08/2016   HCT 25.8 (L) 09/08/2016   MCV 90.4 09/08/2016   PLT 217 09/08/2016    Recent Labs Lab 09/07/16 2011 09/08/16 0225 09/08/16 0445  HGB 9.2* 8.8* 9.2*   Lab Results  Component Value Date   NA 137 09/07/2016   K 3.9 09/07/2016   CL 110 09/07/2016   CO2 24 09/07/2016   BUN 25 (H) 09/07/2016   CREATININE 0.96 09/07/2016   No results found for: ALT, AST, GGT, ALKPHOS, BILITOT No results for input(s): APTT, INR, PTT in the last 168 hours.    Assessment/Plan: Stephen Baker is a 50 y.o. male admitted for anemia, melena, syncope. EGD showed duodenal ulcer and duodenitis. He has completed PPI gtt. Switched to oral, reviewed will need PPI BID x 4-8 weeks at  discharge. Also reviewed importance of avoiding NSAIDs.  Recommendations:  1. Switch to full liquid diet.  2. Monitor h/h, if stable likely d/c tomorrow with PPI BID at discharge.   Please call with questions or concerns.  Case discussed w/ Dr. Vira Agar.   Ronney Asters, PA-C [place]

## 2016-09-08 NOTE — Progress Notes (Signed)
Patient ID: Stephen Baker, male   DOB: [birth year], 50 y.o.   MRN: [id number]  [place] PROGRESS NOTE  Stephen Baker [id number] DOB: 01-10-1966 DOA: 09/06/2016 PCP: Cyndi Bender, PA-C  HPI/Subjective: Patient has no complaint. Status post EGD yesterday.  Objective: Vitals:   09/07/16 2005 09/08/16 0407  BP: 117/79 122/77  Pulse: 85 84  Resp: 18   Temp: 97.8 F (36.6 C) 98.5 F (36.9 C)    Filed Weights   09/06/16 2108 09/07/16 0214  Weight: 165 lb (74.8 kg) 172 lb (78 kg)    ROS: Review of Systems  Constitutional: Negative for chills and fever.  Eyes: Negative for blurred vision.  Respiratory: Negative for cough and shortness of breath.   Cardiovascular: Negative for chest pain.  Gastrointestinal: Negative for abdominal pain, constipation, diarrhea, melena, nausea and vomiting.  Genitourinary: Negative for dysuria.  Musculoskeletal: Negative for joint pain.  Neurological: Negative for dizziness and headaches.   Exam: Physical Exam  HENT:  Nose: No mucosal edema.  Mouth/Throat: No oropharyngeal exudate or posterior oropharyngeal edema.  Eyes: Conjunctivae, EOM and lids are normal. Pupils are equal, round, and reactive to light.  Neck: No JVD present. Carotid bruit is not present. No edema present. No thyroid mass and no thyromegaly present.  Cardiovascular: S1 normal and S2 normal.  Exam reveals no gallop.   No murmur heard. Pulses:      Dorsalis pedis pulses are 2+ on the right side, and 2+ on the left side.  Respiratory: No respiratory distress. He has no wheezes. He has no rhonchi. He has no rales.  GI: Soft. Bowel sounds are normal. There is no tenderness.  Musculoskeletal:       Right shoulder: He exhibits no swelling.  Lymphadenopathy:    He has no cervical adenopathy.  Neurological: He is alert. No cranial nerve deficit.  Skin: Skin is warm. No rash noted. Nails show no clubbing.  Psychiatric: He has a normal mood and affect.      Data  Reviewed: Basic Metabolic Panel:  Recent Labs Lab 09/06/16 2111 09/07/16 0800  NA 139 137  K 3.7 3.9  CL 104 110  CO2 27 24  GLUCOSE 154* 106*  BUN 35* 25*  CREATININE 1.11 0.96  CALCIUM 8.2* 7.9*   CBC:  Recent Labs Lab 09/06/16 2111 09/07/16 0032  09/07/16 0800 09/07/16 1414 09/07/16 2011 09/08/16 0225 09/08/16 0445  WBC 8.8  --   --  7.4  --   --   --  7.0  HGB 10.7* 9.1*  < > 8.9* 9.4* 9.2* 8.8* 9.2*  HCT 30.1* 26.2*  --  26.2*  --   --   --  25.8*  MCV 90.8  --   --  92.6  --   --   --  90.4  PLT 254  --   --  213  --   --   --  217  < > = values in this interval not displayed. Cardiac Enzymes:  Recent Labs Lab 09/06/16 2111 09/07/16 0259 09/07/16 0800 09/07/16 1414  TROPONINI <0.03 <0.03 <0.03 <0.03    CBG:  Recent Labs Lab 09/06/16 2116  GLUCAP 136*     Scheduled Meds: . pantoprazole  40 mg Oral BID AC  . sodium chloride flush  3 mL Intravenous Q12H   Continuous Infusions:    Assessment/Plan:  1. Acute hemorrhagic anemia, due to upper GI bleed with melena secondary to PUD.  EKG showed duodenal ulcer. He was on  Protonix drip. Change to by mouth twice a day. I advised to stop NSAIDS and BC powder.  No active bleeding, stable hemoglobins. Transfuse if needed. Patient has had history of ulcer in the past. Continue clear liquid today, may advance to full liquid diet tomorrow for a few days per Dr. Tiffany Kocher. 2. Syncope secondary to acute blood loss anemia.   I discussed with Dr. Tiffany Kocher. Code Status:     Code Status Orders        Start     Ordered   09/07/16 0216  Full code  Continuous     09/07/16 0215    Code Status History    Date Active Date Inactive Code Status Order ID Comments User Context   This patient has a current code status but no historical code status.    Advance Directive Documentation   Flowsheet Row Most Recent Value  Type of Advance Directive  Healthcare Power of Attorney  Pre-existing out of facility DNR order  (yellow form or pink MOST form)  No data  "MOST" Form in Place?  No data     Family Communication: I discussed with patient and his wife at bedside Disposition Plan: Potentially home tomorrow.  Consultants:  Gastroenterology  Time spent: 35 minutes  Demetrios Loll  [employer]

## 2016-09-09 LAB — HEMOGLOBIN: HEMOGLOBIN: 9.7 g/dL — AB (ref 13.0–18.0)

## 2016-09-09 MED ORDER — PANTOPRAZOLE SODIUM 40 MG PO TBEC: 40.0000 mg | DELAYED_RELEASE_TABLET | Freq: Two times a day (BID) | ORAL | 1 refills | Status: DC

## 2016-09-09 NOTE — Discharge Summary (Signed)
[place] [facility] [place] NAME: Stephen Baker    MR#:  [id number]  DATE OF BIRTH:  1965/12/03  DATE OF ADMISSION:  09/06/2016   ADMITTING PHYSICIAN: Lance Coon, MD  DATE OF DISCHARGE: 09/09/2016 10:36 AM  PRIMARY CARE PHYSICIAN: Cyndi Bender, PA-C   ADMISSION DIAGNOSIS:  Syncopal Episode Melena, anemia DISCHARGE DIAGNOSIS:  Principal Problem:   GI bleed Active Problems:   Syncope Acute hemorrhagic anemia, due to upper GI bleed with melena secondary to PUD.   SECONDARY DIAGNOSIS:   Past Medical History:  Diagnosis Date  . GI bleed   . Medical history non-contributory   . Personal history of kidney stones   . Thyroid disease    hyper  . Ulcer ([place])    almost 20 years ago   HOSPITAL COURSE:   1. Acute hemorrhagic anemia, due to upper GI bleed with melena secondary to PUD.  EGD showed duodenal ulcer. He was on Protonix drip. Change to by mouth twice a day. I advised to stop NSAIDS and BC powder.  No active bleeding, stable hemoglobins. Transfuse if needed. Patient has had history of ulcer in the past. He tolerated full liquid diet. Full liquid diet for 3 more days per Dr. Tiffany Kocher. 2. Syncope secondary to acute blood loss anemia.   I discussed with Dr. Tiffany Kocher.  DISCHARGE CONDITIONS:  Stable, discharged to home today. CONSULTS OBTAINED:  Treatment Team:  Manya Silvas, MD DRUG ALLERGIES:  No Known Allergies DISCHARGE MEDICATIONS:     Medication List    STOP taking these medications   aspirin EC 81 MG tablet   cephALEXin 500 MG capsule Commonly known as:  KEFLEX   HYDROmorphone 2 MG tablet Commonly known as:  DILAUDID     TAKE these medications   cetirizine 10 MG tablet Commonly known as:  ZYRTEC Take 10 mg by mouth daily as needed for allergies.   pantoprazole 40 MG tablet Commonly known as:  PROTONIX Take 1 tablet (40 mg total) by mouth 2 (two) times daily before a meal.        DISCHARGE  INSTRUCTIONS:  See AVS.  If you experience worsening of your admission symptoms, develop shortness of breath, life threatening emergency, suicidal or homicidal thoughts you must seek medical attention immediately by calling 911 or calling your MD immediately  if symptoms less severe.  You Must read complete instructions/literature along with all the possible adverse reactions/side effects for all the Medicines you take and that have been prescribed to you. Take any new Medicines after you have completely understood and accpet all the possible adverse reactions/side effects.   Please note  You were cared for by a hospitalist during your hospital stay. If you have any questions about your discharge medications or the care you received while you were in the hospital after you are discharged, you can call the unit and asked to speak with the hospitalist on call if the hospitalist that took care of you is not available. Once you are discharged, your primary care physician will handle any further medical issues. Please note that NO REFILLS for any discharge medications will be authorized once you are discharged, as it is imperative that you return to your primary care physician (or establish a relationship with a primary care physician if you do not have one) for your aftercare needs so that they can reassess your need for medications and monitor your lab values.    On the day of Discharge:  VITAL SIGNS:  Blood pressure 114/76, pulse 74, temperature 98.2 F (36.8 C), temperature source Oral, resp. rate 20, height 5\' 9"  (1.753 m), weight 172 lb (78 kg), SpO2 98 %. PHYSICAL EXAMINATION:  GENERAL:  50 y.o.-year-old patient lying in the bed with no acute distress.  EYES: Pupils equal, round, reactive to light and accommodation. No scleral icterus. Extraocular muscles intact.  HEENT: Head atraumatic, normocephalic. Oropharynx and nasopharynx clear.  NECK:  Supple, no jugular venous distention. No thyroid  enlargement, no tenderness.  LUNGS: Normal breath sounds bilaterally, no wheezing, rales,rhonchi or crepitation. No use of accessory muscles of respiration.  CARDIOVASCULAR: S1, S2 normal. No murmurs, rubs, or gallops.  ABDOMEN: Soft, non-tender, non-distended. Bowel sounds present. No organomegaly or mass.  EXTREMITIES: No pedal edema, cyanosis, or clubbing.  NEUROLOGIC: Cranial nerves II through XII are intact. Muscle strength 5/5 in all extremities. Sensation intact. Gait not checked.  PSYCHIATRIC: The patient is alert and oriented x 3.  SKIN: No obvious rash, lesion, or ulcer.  DATA REVIEW:   CBC  Recent Labs Lab 09/08/16 0445 09/09/16 0429  WBC 7.0  --   HGB 9.2* 9.7*  HCT 25.8*  --   PLT 217  --     Chemistries   Recent Labs Lab 09/07/16 0800  NA 137  K 3.9  CL 110  CO2 24  GLUCOSE 106*  BUN 25*  CREATININE 0.96  CALCIUM 7.9*     Microbiology Results  No results found for this or any previous visit.  RADIOLOGY:  No results found.   Management plans discussed with the patient, his wife and they are in agreement.  CODE STATUS:  Code Status History    Date Active Date Inactive Code Status Order ID Comments User Context   09/07/2016  2:15 AM 09/09/2016  1:41 PM Full Code [id number]  Lance Coon, MD Inpatient    Advance Directive Documentation   Flowsheet Row Most Recent Value  Type of Advance Directive  Healthcare Power of Attorney  Pre-existing out of facility DNR order (yellow form or pink MOST form)  No data  "MOST" Form in Place?  No data      TOTAL TIME TAKING CARE OF THIS PATIENT: 32 minutes.    Demetrios Loll M.D on 09/09/2016 at 3:39 PM  Between 7am to 6pm - Pager - [phone]  After 6pm go to [email or web address] - password [email or web address] [facility]  [facility]  Office  [phone]  CC: Primary care physician; Cyndi Bender, PA-C   Note: This dictation was prepared with Dragon dictation along with smaller phrase technology. Any  transcriptional errors that result from this process are unintentional.

## 2016-09-09 NOTE — Discharge Instructions (Signed)
Avoid NSAIDS. Full liquid diet for 3 days, then soft diet.

## 2016-09-09 NOTE — Progress Notes (Signed)
Pt being discharged home, discharge instructions and prescriptions reviewed with pt and wife, states understanding, pt with no complaints at discharged, no distress or discomfort noted

## 2016-09-10 ENCOUNTER — Encounter: Payer: Self-pay | Admitting: Unknown Physician Specialty

## 2016-11-12 ENCOUNTER — Emergency Department
Admission: EM | Admit: 2016-11-12 | Discharge: 2016-11-12 | Disposition: A | Discharge status: Home / Self Care | Payer: BLUE CROSS/BLUE SHIELD | Attending: Emergency Medicine | Admitting: Emergency Medicine

## 2016-11-12 ENCOUNTER — Encounter: Payer: Self-pay | Admitting: Emergency Medicine

## 2016-11-12 DIAGNOSIS — K921 Melena: Secondary | ICD-10-CM | POA: Diagnosis not present

## 2016-11-12 DIAGNOSIS — R110 Nausea: Secondary | ICD-10-CM | POA: Diagnosis not present

## 2016-11-12 LAB — COMPREHENSIVE METABOLIC PANEL
ALT: 12 U/L — ABNORMAL LOW (ref 17–63)
AST: 21 U/L (ref 15–41)
Albumin: 4.5 g/dL (ref 3.5–5.0)
Alkaline Phosphatase: 67 U/L (ref 38–126)
Anion gap: 7 (ref 5–15)
BILIRUBIN TOTAL: 0.6 mg/dL (ref 0.3–1.2)
BUN: 14 mg/dL (ref 6–20)
CHLORIDE: 102 mmol/L (ref 101–111)
CO2: 26 mmol/L (ref 22–32)
Calcium: 9.4 mg/dL (ref 8.9–10.3)
Creatinine, Ser: 1.28 mg/dL — ABNORMAL HIGH (ref 0.61–1.24)
Glucose, Bld: 121 mg/dL — ABNORMAL HIGH (ref 65–99)
POTASSIUM: 4.3 mmol/L (ref 3.5–5.1)
Sodium: 135 mmol/L (ref 135–145)
TOTAL PROTEIN: 8 g/dL (ref 6.5–8.1)

## 2016-11-12 LAB — CBC
HCT: 38.4 % — ABNORMAL LOW (ref 40.0–52.0)
Hemoglobin: 12.9 g/dL — ABNORMAL LOW (ref 13.0–18.0)
MCH: 27.2 pg (ref 26.0–34.0)
MCHC: 33.7 g/dL (ref 32.0–36.0)
MCV: 80.7 fL (ref 80.0–100.0)
Platelets: 321 10*3/uL (ref 150–440)
RBC: 4.76 MIL/uL (ref 4.40–5.90)
RDW: 15 % — ABNORMAL HIGH (ref 11.5–14.5)
WBC: 8.3 10*3/uL (ref 3.8–10.6)

## 2016-11-12 LAB — TYPE AND SCREEN
ABO/RH(D): O POS
Antibody Screen: NEGATIVE

## 2016-11-12 NOTE — ED Triage Notes (Signed)
Pt reports dark stool yesterday and today. Pt reports nausea this morning. Denies pain. Denies vomiting or diarrhea. Pt reports recent history of bleeding ulcer.

## 2016-11-12 NOTE — Discharge Instructions (Signed)
Please be aware that sometimes, iron pills as well as abysmal can cause dark stool. If you have ongoing dark stool, any blood in her stool, any vomiting of blood, any abdominal pain, any numbness or weakness, chest pain or shortness of breath, or you feel worse in any way please return to the emergency department. Follow closely with primary care doctor.

## 2016-11-12 NOTE — ED Provider Notes (Signed)
[facility] Emergency Department Provider Note  ____________________________________________   I have reviewed the triage vital signs and the nursing notes.   HISTORY  Chief Complaint Nausea and Dark Stools    HPI Stephen Baker is a 51 y.o. male with a history of ulcers and GI bleed. Patient is on antiacid. He states that he took a Pepto-Bismol 2 days ago and an iron pill yesterday and then noted dark stools. He was concerned he might be having another GI bleed. He did not vomit. He states after he saw the dark stool he's felt somewhat nauseated. He denies any chest pain or shortness of breath. Denies lightheadedness and feels otherwise normal. Denies abdominal pain, does not feel otherwise that he felt when he has had GI bleeds in the past. Denies actually vomit had no hematemesis. Did not have any bright red blood per rectum.   Past Medical History:  Diagnosis Date  . GI bleed   . Medical history non-contributory   . Personal history of kidney stones   . Thyroid disease    hyper  . Ulcer ([place])    almost 20 years ago    Patient Active Problem List   Diagnosis Date Noted  . GI bleed 09/07/2016  . Syncope 09/07/2016    Past Surgical History:  Procedure Laterality Date  . ESOPHAGOGASTRODUODENOSCOPY (EGD) WITH PROPOFOL N/A 09/07/2016   Procedure: ESOPHAGOGASTRODUODENOSCOPY (EGD) WITH PROPOFOL;  Surgeon: Manya Silvas, MD;  Location: [facility] ENDOSCOPY;  Service: Endoscopy;  Laterality: N/A;  . OTHER SURGICAL HISTORY    . SHOULDER ARTHROSCOPY WITH ROTATOR CUFF REPAIR AND SUBACROMIAL DECOMPRESSION Right 10/08/2013   Procedure: RIGHT SHOULDER ARTHROSCOPY WITH SUBACROMIAL DECOMPRESSION/DISTAL CLAVICLE RESECTION ;  Surgeon: Cammie Sickle., MD;  Location: [place];  Service: Orthopedics;  Laterality: Right;  . WISDOM TOOTH EXTRACTION      Prior to Admission medications   Medication Sig Start Date End Date Taking? Authorizing Provider   cetirizine (ZYRTEC) 10 MG tablet Take 10 mg by mouth daily as needed for allergies.    Historical Provider, MD  pantoprazole (PROTONIX) 40 MG tablet Take 1 tablet (40 mg total) by mouth 2 (two) times daily before a meal. 09/09/16   Demetrios Loll, MD    Allergies Patient has no known allergies.  Family History  Problem Relation Age of Onset  . Diabetes Mother   . Heart disease Paternal Grandfather     Social History Social History  Substance Use Topics  . Smoking status: Never Smoker  . Smokeless tobacco: Never Used  . Alcohol use No    Review of Systems Constitutional: No fever/chills Eyes: No visual changes. ENT: No sore throat. No stiff neck no neck pain Cardiovascular: Denies chest pain. Respiratory: Denies shortness of breath. Gastrointestinal:   no vomiting.  No diarrhea.  No constipation. Genitourinary: Negative for dysuria. Musculoskeletal: Negative lower extremity swelling Skin: Negative for rash. Neurological: Negative for severe headaches, focal weakness or numbness. 10-point ROS otherwise negative.  ____________________________________________   PHYSICAL EXAM:  VITAL SIGNS: ED Triage Vitals  Enc Vitals Group     BP 11/12/16 0808 (!) 166/101     Pulse Rate 11/12/16 0808 (!) 106     Resp 11/12/16 0808 18     Temp 11/12/16 0808 97.4 F (36.3 C)     Temp Source 11/12/16 0808 Oral     SpO2 11/12/16 0808 99 %     Weight 11/12/16 0809 165 lb (74.8 kg)  Height 11/12/16 0809 5\' 8"  (1.727 m)     Head Circumference --      Peak Flow --      Pain Score --      Pain Loc --      Pain Edu? --      Excl. in [place]? --     Constitutional: Alert and oriented. Well appearing and in no acute distress. Eyes: Conjunctivae are normal. PERRL. EOMI. Head: Atraumatic. Nose: No congestion/rhinnorhea. Mouth/Throat: Mucous membranes are moist.  Oropharynx non-erythematous. Neck: No stridor.   Nontender with no meningismus Cardiovascular: Normal rate, regular rhythm. Grossly  normal heart sounds.  Good peripheral circulation. Respiratory: Normal respiratory effort.  No retractions. Lungs CTAB. Abdominal: Soft and nontender. No distention. No guarding no rebound Back:  There is no focal tenderness or step off.  there is no midline tenderness there are no lesions noted. there is no CVA tenderness Rectal exam: Guaiac negative brown stool Musculoskeletal: No lower extremity tenderness, no upper extremity tenderness. No joint effusions, no DVT signs strong distal pulses no edema Neurologic:  Normal speech and language. No gross focal neurologic deficits are appreciated.  Skin:  Skin is warm, dry and intact. No rash noted. Psychiatric: Mood and affect are normal. Speech and behavior are normal.  ____________________________________________   LABS (all labs ordered are listed, but only abnormal results are displayed)  Labs Reviewed  COMPREHENSIVE METABOLIC PANEL - Abnormal; Notable for the following:       Result Value   Glucose, Bld 121 (*)    Creatinine, Ser 1.28 (*)    ALT 12 (*)    All other components within normal limits  CBC - Abnormal; Notable for the following:    Hemoglobin 12.9 (*)    HCT 38.4 (*)    RDW 15.0 (*)    All other components within normal limits  POC OCCULT BLOOD, ED  TYPE AND SCREEN   ____________________________________________  EKG  I personally interpreted any EKGs ordered by me or triage  ____________________________________________  RADIOLOGY  I reviewed any imaging ordered by me or triage that were performed during my shift and, if possible, patient and/or family made aware of any abnormal findings. ____________________________________________   PROCEDURES  Procedure(s) performed: None  Procedures  Critical Care performed: None  ____________________________________________   INITIAL IMPRESSION / ASSESSMENT AND PLAN / ED COURSE  Pertinent labs & imaging results that were available during my care of the patient  were reviewed by me and considered in my medical decision making (see chart for details).  Patient states he was very anxious about the possibility of bleed after he had dark stools. However, Pepto-Bismol and iron pills it is certainly possible to have Brown or black stool which is not blood in this case it does appear to be that is was going on. He is guaiac-negative from below seen what was higher than it is ever been. His vital signs are reassuring. He is in no distress. He would like to leave at this time. Nothing at the sinus suggest ACS PE or dissection has no abdominal pain chest pain or short of breath. He did have some mild nausea when he is worried that he might be bleeding however there is no evidence of vomiting. Extensive return precautions given and understood. Patient understands he can come back at any time if he feels worse.  Clinical Course    ____________________________________________   FINAL CLINICAL IMPRESSION(S) / ED DIAGNOSES  Final diagnoses:  None  This chart was dictated using voice recognition software.  Despite best efforts to proofread,  errors can occur which can change meaning.      Schuyler Amor, MD 11/12/16 1051

## 2017-09-30 ENCOUNTER — Other Ambulatory Visit: Payer: Self-pay | Admitting: Nurse Practitioner

## 2017-09-30 DIAGNOSIS — R1110 Vomiting, unspecified: Secondary | ICD-10-CM

## 2017-09-30 DIAGNOSIS — R1310 Dysphagia, unspecified: Secondary | ICD-10-CM

## 2017-10-03 ENCOUNTER — Ambulatory Visit
Admission: RE | Admit: 2017-10-03 | Discharge: 2017-10-03 | Disposition: A | Discharge status: Home / Self Care | Payer: BLUE CROSS/BLUE SHIELD | Source: Ambulatory Visit | Attending: Nurse Practitioner | Admitting: Nurse Practitioner

## 2017-10-03 DIAGNOSIS — K228 Other specified diseases of esophagus: Secondary | ICD-10-CM | POA: Diagnosis not present

## 2017-10-03 DIAGNOSIS — R1110 Vomiting, unspecified: Secondary | ICD-10-CM | POA: Diagnosis present

## 2017-10-03 DIAGNOSIS — R1310 Dysphagia, unspecified: Secondary | ICD-10-CM | POA: Diagnosis present

## 2018-07-13 IMAGING — RF DG UGI W/ HIGH DENSITY W/O KUB
14 of 16 series · 15 of 24 positions shown · non-contrast
Comparison: Chest radiograph with ribs 11/24/2013.

CLINICAL DATA: Dysphagia for several months with solids in after
eating a large meal.

EXAM:
UPPER GI SERIES WITHOUT KUB
TECHNIQUE: Routine upper GI series was performed with thin and high density
barium.
FLUOROSCOPY TIME:  1 minutes 6 seconds corresponding to a Dose Area
Product of 2393 ?Gy*m2

[Series 1: fluoro_barium 2fps_bw · 0.18mm/px · 1 of 4 frames shown (1 of 12)]
[frame 1/4]
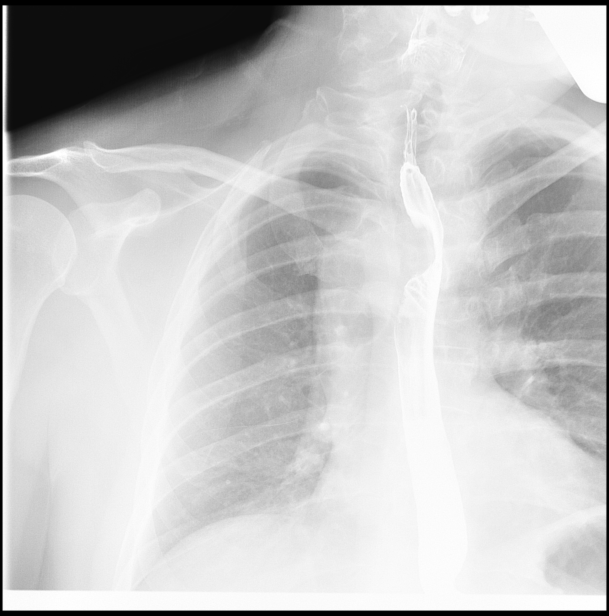

[Series 2: fluoro_barium 2fps_bw · 0.18mm/px · 1 of 2 frames shown (2 of 12)]
[frame 1/2]
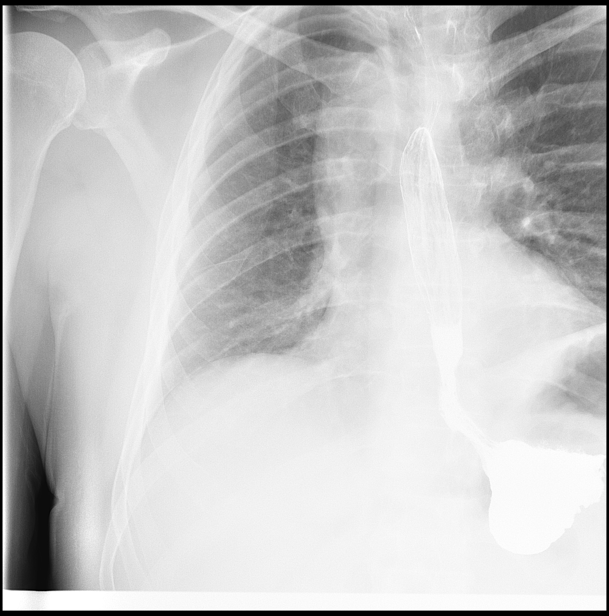

[Series 3: fluoro_barium 2fps_bw · 0.18mm/px · 1 of 3 frames shown (3 of 12)]
[frame 3/3]
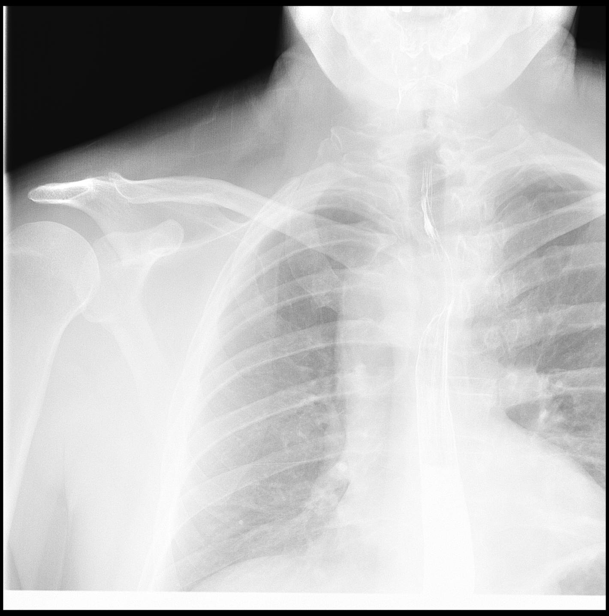

[Series 4: fluoro_barium 2fps_bw · 0.18mm/px · 1 of 4 frames shown (4 of 12)]
[frame 1/4]
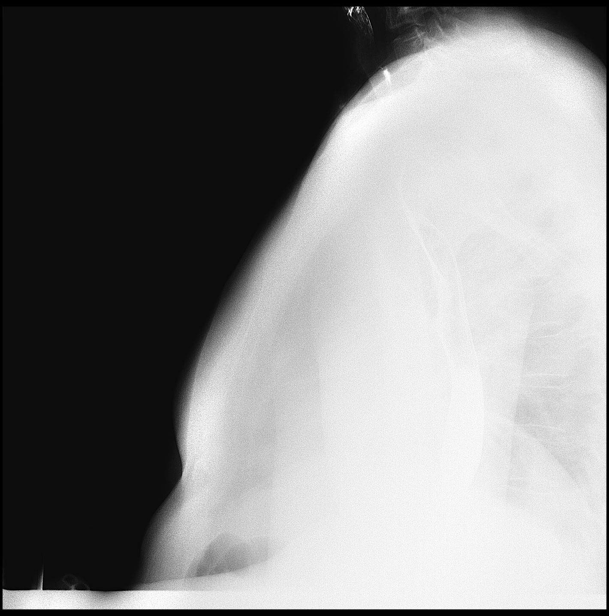

[Series 5: fluoro_barium 2fps_bw · 0.17mm/px · 1 of 2 frames shown (5 of 12)]
[frame 2/2]
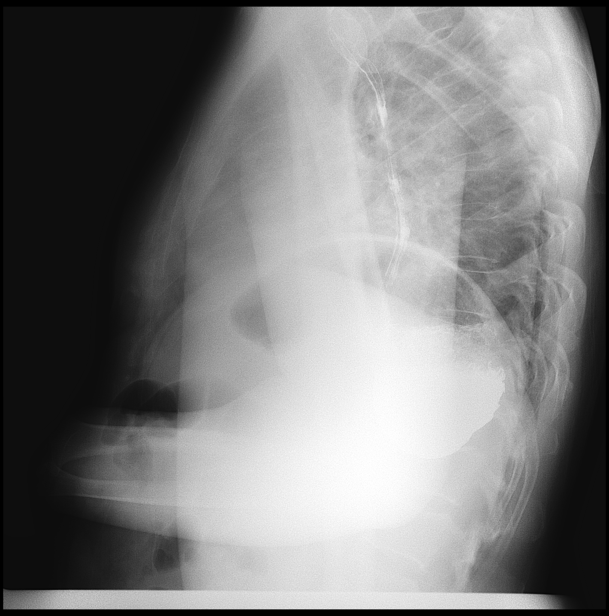

[Series 6: fluoro_barium 2fps_bw · 0.17mm/px · 1 of 2 frames shown (6 of 12)]
[frame 1/2]
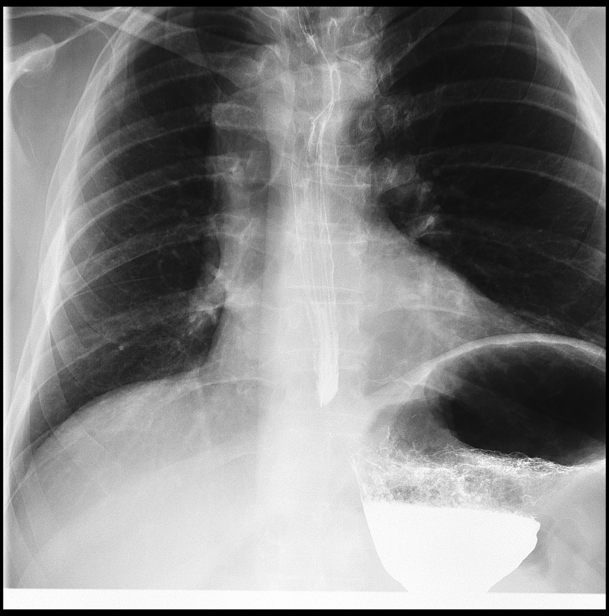

[Series 8: fluoro_barium 2fps_bw · 0.18mm/px · 1 of 2 frames shown (7 of 12)]
[frame 1/2]
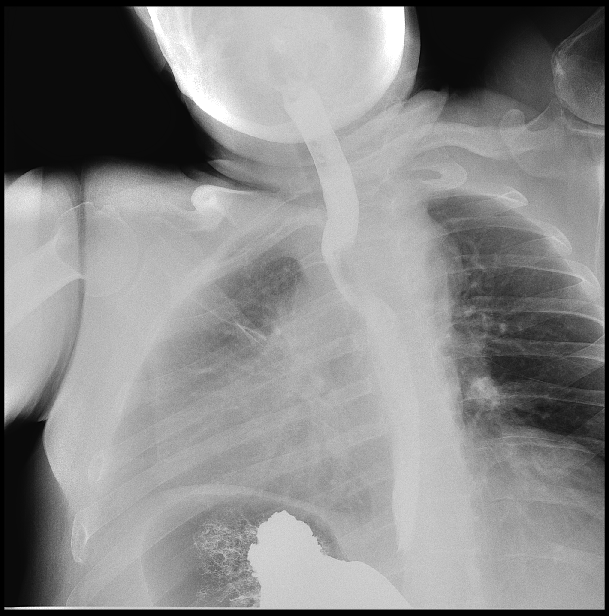

[Series 9: fluoro_barium 2fps_bw · 0.18mm/px · 1 of 2 frames shown (8 of 12)]
[frame 2/2]
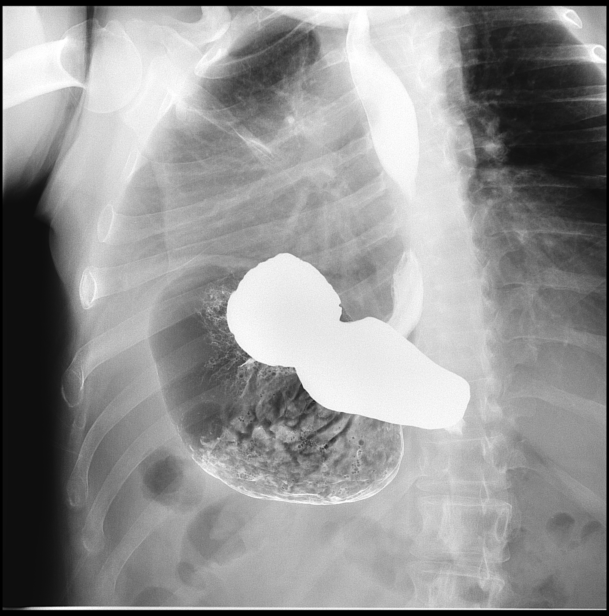

[Series 10: fluoro_barium 2fps_bw · 0.18mm/px · 1 of 2 frames shown (9 of 12)]
[frame 1/2]
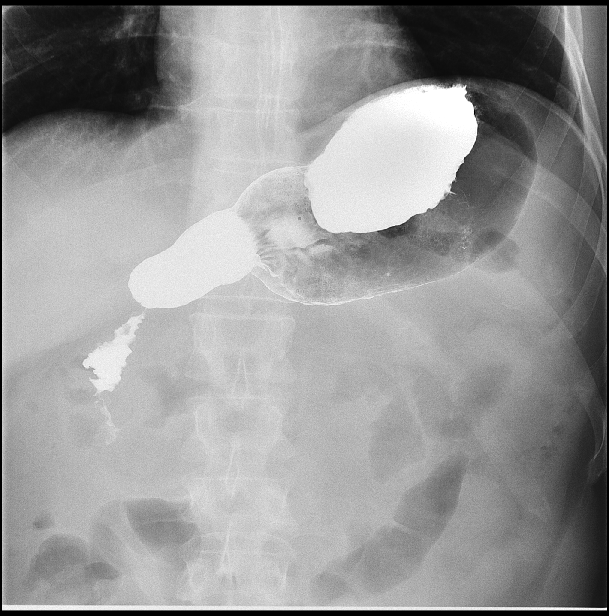

[Series 12: fluoro_barium 2fps_bw · 0.19mm/px · 2 of 2 frames shown (10 of 12)]
[frame 1/2]
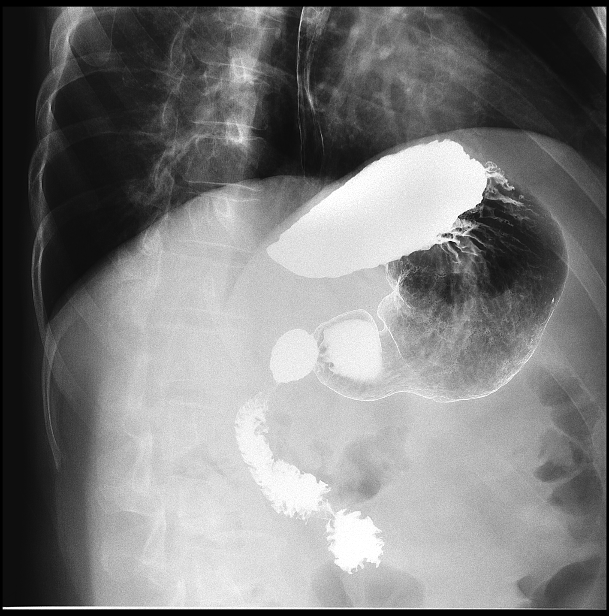
[frame 2/2]
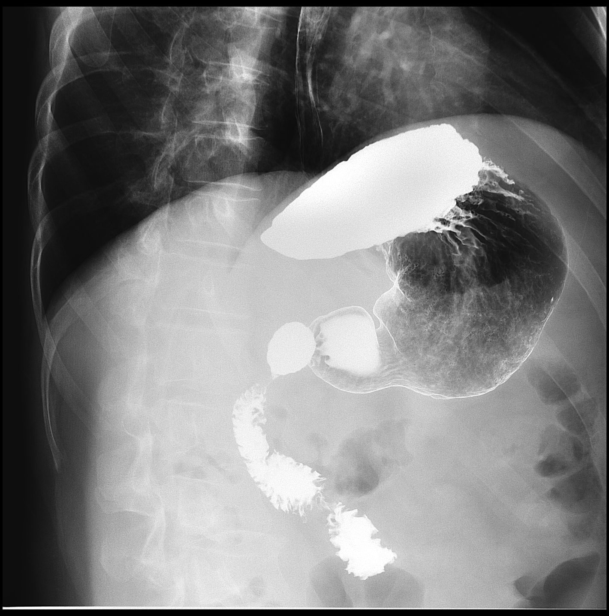

[Series 13: cp_standard · 0.56mm/px · 1 of 69 frames shown (1 of 2)]
[frame 64/69]
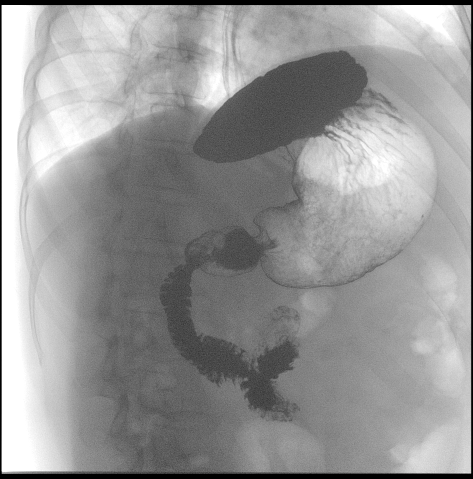

[Series 14: cp_standard · 0.56mm/px · 1 of 23 frames shown (2 of 2)]
[frame 20/23]
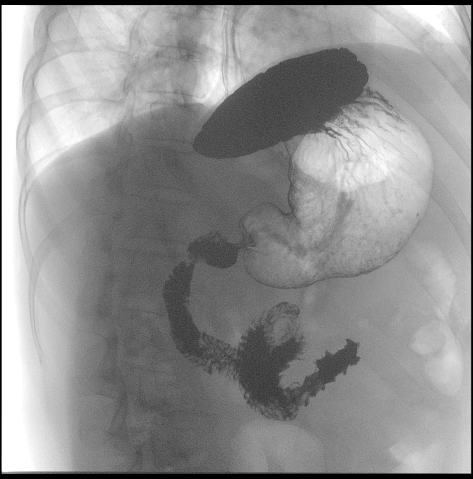

[Series 15: fluoro_barium 2fps_bw · 0.19mm/px · 1 of 2 frames shown (11 of 12)]
[frame 1/2]
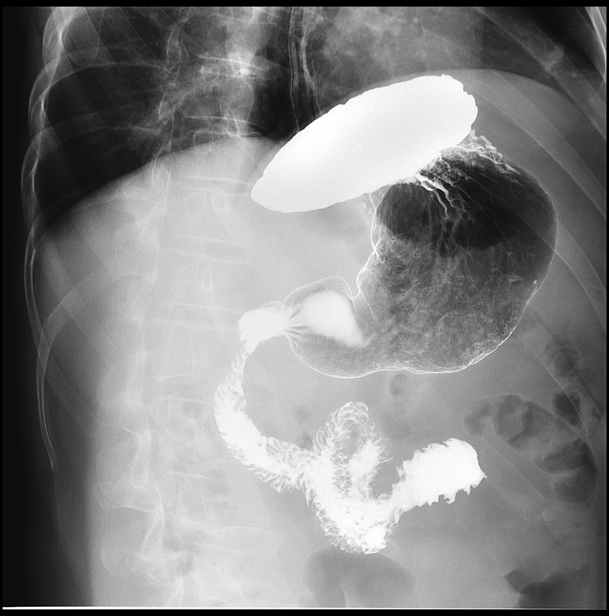

[Series 16: fluoro_barium 2fps_bw · 0.19mm/px · 1 of 2 frames shown (12 of 12)]
[frame 2/2]
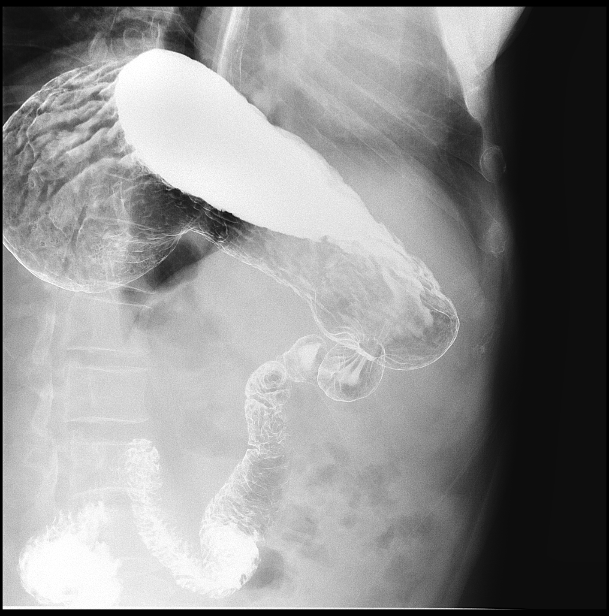

[15 of 24 positions shown; findings below may reference images not displayed]

FINDINGS: The patient was able to swallow barium without difficulty. There is
a RIGHT-sided aortic arch, with displacement of the esophagus to the
LEFT at the level of the clavicles. There was no significant hang up
of barium at this level, although the effacement and displacement is
rather prominent. There was no evidence for esophagitis or
esophageal mass. No gastroesophageal reflux was observed.

The stomach was normal in contour without ulceration or mass.
Pyloric channel was normal. Duodenum bulb was free of deformity or
ulceration.
IMPRESSION: The patient's dysphagia could be related to a RIGHT-sided aortic
arch, with mid esophageal displacement RIGHT-to-LEFT, and effacement
at that level.

Otherwise the esophagus, stomach, and duodenum bulb were normal.

## 2022-01-09 ENCOUNTER — Other Ambulatory Visit: Payer: Self-pay

## 2022-01-09 ENCOUNTER — Ambulatory Visit (AMBULATORY_SURGERY_CENTER): Payer: BLUE CROSS/BLUE SHIELD | Admitting: *Deleted

## 2022-01-09 VITALS — Ht 67.0 in | Wt 165.0 lb

## 2022-01-09 DIAGNOSIS — Z1211 Encounter for screening for malignant neoplasm of colon: Secondary | ICD-10-CM

## 2022-01-09 NOTE — Progress Notes (Signed)
No egg or soy allergy known to patient  ?No issues known to pt with past sedation with any surgeries or procedures ?Patient denies ever being told they had issues or difficulty with intubation  ?No FH of Malignant Hyperthermia ?Pt is not on diet pills ?Pt is not on  home 02  ?Pt is not on blood thinners  ?Pt denies issues with constipation  ?No A fib or A flutter ? ?Pt is not vaccinated  for Covid   ? ?Due to the COVID-19 pandemic we are asking patients to follow certain guidelines in PV and the [place]   ?Pt aware of COVID protocols and [facility] guidelines  ? ?PV completed over the phone. Pt verified name, DOB, address and insurance during PV today.  ?Pt mailed instruction packet with copy of consent form to read and not return, and instructions.  ?Pt encouraged to call with questions or issues.  ?If pt has My chart, procedure instructions sent via My Chart   ? ?Sample sheet of over the counter items to purchase mailed with packet. ?

## 2022-01-10 ENCOUNTER — Encounter: Payer: Self-pay | Admitting: Emergency Medicine

## 2022-01-15 ENCOUNTER — Encounter: Payer: Self-pay | Admitting: Certified Registered Nurse Anesthetist

## 2022-01-23 ENCOUNTER — Other Ambulatory Visit: Payer: Self-pay | Admitting: Gastroenterology

## 2022-01-23 ENCOUNTER — Encounter: Payer: Self-pay | Admitting: Gastroenterology

## 2022-01-23 ENCOUNTER — Ambulatory Visit (AMBULATORY_SURGERY_CENTER): Payer: 59 | Admitting: Gastroenterology

## 2022-01-23 VITALS — BP 110/80 | HR 68 | Temp 98.4°F | Resp 9 | Ht 67.0 in | Wt 165.0 lb

## 2022-01-23 DIAGNOSIS — D123 Benign neoplasm of transverse colon: Secondary | ICD-10-CM | POA: Diagnosis not present

## 2022-01-23 DIAGNOSIS — Z1211 Encounter for screening for malignant neoplasm of colon: Secondary | ICD-10-CM | POA: Diagnosis not present

## 2022-01-23 MED ORDER — SODIUM CHLORIDE 0.9 % IV SOLN: 500.0000 mL | Freq: Once | INTRAVENOUS | Status: DC

## 2022-01-23 NOTE — Progress Notes (Signed)
VS by CW  Pt's states no medical or surgical changes since previsit or office visit.  

## 2022-01-23 NOTE — Patient Instructions (Signed)
Handout provided on polyps.   YOU HAD AN ENDOSCOPIC PROCEDURE TODAY AT THE [facility]:   Refer to the procedure report that was given to you for any specific questions about what was found during the examination.  If the procedure report does not answer your questions, please call your gastroenterologist to clarify.  If you requested that your care partner not be given the details of your procedure findings, then the procedure report has been included in a sealed envelope for you to review at your convenience later.  YOU SHOULD EXPECT: Some feelings of bloating in the abdomen. Passage of more gas than usual.  Walking can help get rid of the air that was put into your GI tract during the procedure and reduce the bloating. If you had a lower endoscopy (such as a colonoscopy or flexible sigmoidoscopy) you may notice spotting of blood in your stool or on the toilet paper. If you underwent a bowel prep for your procedure, you may not have a normal bowel movement for a few days.  Please Note:  You might notice some irritation and congestion in your nose or some drainage.  This is from the oxygen used during your procedure.  There is no need for concern and it should clear up in a day or so.  SYMPTOMS TO REPORT IMMEDIATELY:  Following lower endoscopy (colonoscopy or flexible sigmoidoscopy):  Excessive amounts of blood in the stool  Significant tenderness or worsening of abdominal pains  Swelling of the abdomen that is new, acute  Fever of 100F or higher  For urgent or emergent issues, a gastroenterologist can be reached at any hour by calling [phone]. Do not use MyChart messaging for urgent concerns.    DIET:  We do recommend a small meal at first, but then you may proceed to your regular diet.  Drink plenty of fluids but you should avoid alcoholic beverages for 24 hours.  ACTIVITY:  You should plan to take it easy for the rest of today and you should NOT DRIVE or use heavy  machinery until tomorrow (because of the sedation medicines used during the test).    FOLLOW UP: Our staff will call the number listed on your records 48-72 hours following your procedure to check on you and address any questions or concerns that you may have regarding the information given to you following your procedure. If we do not reach you, we will leave a message.  We will attempt to reach you two times.  During this call, we will ask if you have developed any symptoms of COVID 19. If you develop any symptoms (ie: fever, flu-like symptoms, shortness of breath, cough etc.) before then, please call [phone].  If you test positive for Covid 19 in the 2 weeks post procedure, please call and report this information to us.    If any biopsies were taken you will be contacted by phone or by letter within the next 1-3 weeks.  Please call us at [phone] if you have not heard about the biopsies in 3 weeks.    SIGNATURES/CONFIDENTIALITY: You and/or your care partner have signed paperwork which will be entered into your electronic medical record.  These signatures attest to the fact that that the information above on your After Visit Summary has been reviewed and is understood.  Full responsibility of the confidentiality of this discharge information lies with you and/or your care-partner.  

## 2022-01-23 NOTE — Progress Notes (Signed)
HPI: ?This is a man at routine risk for CRC ? ? ?ROS: complete GI ROS as described in HPI, all other review negative. ? ?Constitutional:  No unintentional weight loss ? ? ?Past Medical History:  ?Diagnosis Date  ? GERD (gastroesophageal reflux disease)   ? GI bleed   ? Kidney stones   ? Medical history non-contributory   ? Personal history of kidney stones   ? Thyroid disease   ? hyper  ? Thyroid disease   ? hyperactive  ? Ulcer   ? almost 20 years ago  ? ? ?Past Surgical History:  ?Procedure Laterality Date  ? bleeding stomach ulcer    ? 2005 or 2006  ? ESOPHAGOGASTRODUODENOSCOPY (EGD) WITH PROPOFOL N/A 09/07/2016  ? Procedure: ESOPHAGOGASTRODUODENOSCOPY (EGD) WITH PROPOFOL;  Surgeon: Manya Silvas, MD;  Location[facility] END[facility]COPY;  Service: Endoscopy;  Laterality: N/A;  ? OTHER SURGICAL HISTORY    ? SHOULDER ARTHROSCOPY WITH ROTATOR CUFF REPAIR AND SUBACROMIAL DECOMPRESSION Right 10/08/2013  ? Procedure: RIGHT SHOULDER ARTHROSCOPY WITH SUBACROMIAL DECOMPRESSION/DISTAL CLAVICLE RESECTION ;  Surgeon: Cammie Sickle., MD;  Location: [place];  Service: Orthopedics;  Laterality: Right;  ? SHOULDER SURGERY Right   ? WISDOM TOOTH EXTRACTION    ? ? ?Current Outpatient Medications  ?Medication Sig Dispense Refill  ? cetirizine (ZYRTEC) 10 MG tablet Take 10 mg by mouth daily as needed for allergies.    ? diphenhydrAMINE-APAP, sleep, (TYLENOL PM EXTRA STRENGTH PO) Take by mouth as needed.    ? pantoprazole (PROTONIX) 40 MG tablet Take 1 tablet (40 mg total) by mouth 2 (two) times daily before a meal. 60 tablet 1  ? ?Current Facility-Administered Medications  ?Medication Dose Route Frequency Provider Last Rate Last Admin  ? 0.9 %  sodium chloride infusion  500 mL Intravenous Once Milus Banister, MD      ? ? ?Allergies as of 01/23/2022 - Review Complete 01/23/2022  ?Allergen Reaction Noted  ? Pantoprazole Itching 10/09/2016  ? ? ?Family History  ?Problem Relation Age of Onset  ? Colon cancer Neg Hx   ?  Colon polyps Neg Hx   ? Esophageal cancer Neg Hx   ? Rectal cancer Neg Hx   ? Stomach cancer Neg Hx   ? Diabetes Mother   ? Heart disease Paternal Grandfather   ? ? ?Social History  ? ?Socioeconomic History  ? Marital status: Married  ?  Spouse name: Not on file  ? Number of children: Not on file  ? Years of education: Not on file  ? Highest education level: Not on file  ?Occupational History  ? Not on file  ?Tobacco Use  ? Smoking status: Never  ?  Passive exposure: Never  ? Smokeless tobacco: Never  ?Vaping Use  ? Vaping Use: Never used  ?Substance and Sexual Activity  ? Alcohol use: Yes  ?  Comment: 1 beer per day  ? Drug use: Never  ? Sexual activity: Not on file  ?Other Topics Concern  ? Not on file  ?Social History Narrative  ? ** Merged History Encounter **  ?    ? ?Social Determinants of Health  ? ?Financial Resource Strain: Not on file  ?Food Insecurity: Not on file  ?Transportation Needs: Not on file  ?Physical Activity: Not on file  ?Stress: Not on file  ?Social Connections: Not on file  ?Intimate Partner Violence: Not on file  ? ? ? ?Physical Exam: ?BP (!) 148/92   Pulse 92  Temp 98.4 ?F (36.9 ?C)   Ht '5\' 7"'$  (1.702 m)   Wt 165 lb (74.8 kg)   SpO2 100%   BMI 25.84 kg/m?  ?Constitutional: generally well-appearing ?Psychiatric: alert and oriented x3 ?Lungs: CTA bilaterally ?Heart: no MCR ? ?Assessment and plan: ?56 y.o. male with routine risk for CRC ? ?Screening colonoscopy today ? ?Care is appropriate for the ambulatory setting. ? ?Owens Loffler, MD ?[facility] ?01/23/2022, 9:33 AM ? ? ? ?

## 2022-01-23 NOTE — Op Note (Signed)
[place] ?Patient Name: Stephen Baker ?Procedure Date: 01/23/2022 9:34 AM ?MRN: [id number] ?Endoscopist: Milus Banister , MD ?Age: 56 ?Referring MD:  ?Date of Birth: 04/23/1966 ?Gender: Male ?Account #: [id number] ?Procedure:                Colonoscopy ?Indications:              Screening for colorectal malignant neoplasm ?Medicines:                Monitored Anesthesia Care ?Procedure:                Pre-Anesthesia Assessment: ?                          - Prior to the procedure, a History and Physical  ?                          was performed, and patient medications and  ?                          allergies were reviewed. The patient's tolerance of  ?                          previous anesthesia was also reviewed. The risks  ?                          and benefits of the procedure and the sedation  ?                          options and risks were discussed with the patient.  ?                          All questions were answered, and informed consent  ?                          was obtained. Prior Anticoagulants: The patient has  ?                          taken no previous anticoagulant or antiplatelet  ?                          agents. ASA Grade Assessment: II - A patient with  ?                          mild systemic disease. After reviewing the risks  ?                          and benefits, the patient was deemed in  ?                          satisfactory condition to undergo the procedure. ?                          After obtaining informed consent, the colonoscope  ?  was passed under direct vision. Throughout the  ?                          procedure, the patient's blood pressure, pulse, and  ?                          oxygen saturations were monitored continuously. The  ?                          Olympus [id number]-[id number] (#[id number]) Colonoscope was  ?                          introduced through the anus and advanced to the the  ?                          cecum, identified by  appendiceal orifice and  ?                          ileocecal valve. The colonoscopy was performed  ?                          without difficulty. The patient tolerated the  ?                          procedure well. The quality of the bowel  ?                          preparation was good. The ileocecal valve,  ?                          appendiceal orifice, and rectum were photographed. ?Scope In: 9:39:28 AM ?Scope Out: 9:50:23 AM ?Scope Withdrawal Time: 0 hours 7 minutes 38 seconds  ?Total Procedure Duration: 0 hours 10 minutes 55 seconds  ?Findings:                 A 5 mm polyp was found in the transverse colon. The  ?                          polyp was sessile. The polyp was removed with a  ?                          cold snare. Resection and retrieval were complete. ?                          The exam was otherwise without abnormality on  ?                          direct and retroflexion views. ?Complications:            No immediate complications. Estimated blood loss:  ?                          None. ?Estimated Blood Loss:     Estimated blood loss: none. ?Impression:               -  One 5 mm polyp in the transverse colon, removed  ?                          with a cold snare. Resected and retrieved. ?                          - The examination was otherwise normal on direct  ?                          and retroflexion views. ?Recommendation:           - Patient has a contact number available for  ?                          emergencies. The signs and symptoms of potential  ?                          delayed complications were discussed with the  ?                          patient. Return to normal activities tomorrow.  ?                          Written discharge instructions were provided to the  ?                          patient. ?                          - Resume previous diet. ?                          - Continue present medications. ?                          - Await pathology results. ?Milus Banister, MD ?01/23/2022 9:53:38 AM ?This report has been signed electronically. ?

## 2022-01-23 NOTE — Progress Notes (Signed)
Report given to PACU, vss 

## 2022-01-25 ENCOUNTER — Telehealth: Payer: Self-pay

## 2022-01-25 NOTE — Telephone Encounter (Signed)
?  Follow up Call- ? ? ?  01/23/2022  ?  9:01 AM  ?Call back number  ?Post procedure Call Back phone  # [phone]  ?Permission to leave phone message Yes  ?  ? ?Patient questions: ? ?Do you have a fever, pain , or abdominal swelling? No. ?Pain Score  0 * ? ?Have you tolerated food without any problems? Yes.   ? ?Have you been able to return to your normal activities? Yes.   ? ?Do you have any questions about your discharge instructions: ?Diet   No. ?Medications  No. ?Follow up visit  No. ? ?Do you have questions or concerns about your Care? No. ? ?Actions: ?* If pain score is 4 or above: ?No action needed, pain <4. ? ? ?

## 2022-01-29 ENCOUNTER — Encounter: Payer: Self-pay | Admitting: Gastroenterology

## 2023-12-02 ENCOUNTER — Ambulatory Visit: Payer: Self-pay | Admitting: Physician Assistant

## 2023-12-02 NOTE — Telephone Encounter (Signed)
noted

## 2023-12-02 NOTE — Telephone Encounter (Signed)
Chief Complaint: dizziness/vertigo Symptoms: first episode last Saturday 1/25, episode for 2-3 mins today 1/27 Frequency: onset 1/25, two episodes since symptoms began Pertinent Negatives: Patient denies CP, heart palpitations, SOB, slurred speech, weakness or paralysis of one side of the body, vision changes, headache Disposition: [] ED /[] Urgent Care (no appt availability in office) / [x] Appointment(In office/virtual)/ []  [facility]/ [] Home Care/ [] Refused Recommended Disposition /[] [facility]/ []  Follow-up with PCP Additional Notes: Pt reports new onset of dizziness and vertigo last Saturday 1/25. Pt states he was driving when he started to feel like "everything was spinning." Pt denies CP, heart palpitations, SOB, headache, slurred speech, one-sided weakness at that time. Pt was with his wife on the way to a restaurant. Pt felt unsteady on his legs and his wife assisted him into the restaurant. Pt ate a few bites of food but said he just wanted to go home. On the way home, pt bought a glucometer from [employer] and he checked his BG, which was 177. Pt denies hx of diabetes. Pt states he had another episode today of dizziness lasting 2-3 minutes. Pt denies CP, SOB, weakness or paralysis today as well. Pt states he has a hx of bleeding stomach ulcers but denies black tarry stools. Per protocol, pt scheduled for an appt tomorrow at 1000. Pt advised to call back for any worsening before now and then, or call 911 for weakness, paralysis, slurred speech, passing out etc. Pt verbalized understanding.   Copied from CRM [id number]. Topic: Clinical - Red Word Triage >> Dec 02, 2023 12:19 PM Fonda Kinder J wrote: Red Word that prompted transfer to Nurse Triage: Dizziness Reason for Disposition  [1] MODERATE dizziness (e.g., vertigo; feels very unsteady, interferes with normal activities) AND [2] has NOT been evaluated by doctor (or NP/PA) for this  Answer Assessment - Initial Assessment  Questions 1. DESCRIPTION: "Describe your dizziness."     Got dizzy while driving, felt like everything was spinning. This happened Saturday. "Spell" happened today as well for 2-3 mins. 2. VERTIGO: "Do you feel like either you or the room is spinning or tilting?"      Spinning 3. LIGHTHEADED: "Do you feel lightheaded?" (e.g., somewhat faint, woozy, weak upon standing)     "Yeah, like I am about to go out." 4. SEVERITY: "How bad is it?"  "Can you walk?"   - MILD: Feels slightly dizzy and unsteady, but is walking normally.   - MODERATE: Feels unsteady when walking, but not falling; interferes with normal activities (e.g., school, work).   - SEVERE: Unable to walk without falling, or requires assistance to walk without falling.     "Would have wanted to hold onto something", needed wife close to him to walk in and out of restaurant. 5. ONSET:  "When did the dizziness begin?"     Saturday while driving. Broke out into a sweat, got clammy. No appetite.  6. AGGRAVATING FACTORS: "Does anything make it worse?" (e.g., standing, change in head position)     Heart "flutters", but not associated with dizziness. No CP. No SOB.  7. CAUSE: "What do you think is causing the dizziness?"     Thought it might have been low BG. Bought glucometer at [employer] and it was 177 that night when dizzy. No hx of diabetes. 8. RECURRENT SYMPTOM: "Have you had dizziness before?" If Yes, ask: "When was the last time?" "What happened that time?"     No 9. OTHER SYMPTOMS: "Do you have any other symptoms?" (e.g.,  headache, weakness, numbness, vomiting, earache)     Hx of "two bleeding ulcers, so I know what it's like to go out." Denies black tarry stools.  Protocols used: Dizziness - Vertigo-A-AH

## 2023-12-03 ENCOUNTER — Ambulatory Visit: Payer: 59 | Admitting: Urgent Care

## 2023-12-03 ENCOUNTER — Encounter: Payer: Self-pay | Admitting: Urgent Care

## 2023-12-03 VITALS — BP 182/115 | HR 67 | Ht 69.0 in | Wt 186.8 lb

## 2023-12-03 DIAGNOSIS — R42 Dizziness and giddiness: Secondary | ICD-10-CM | POA: Diagnosis not present

## 2023-12-03 DIAGNOSIS — Z8639 Personal history of other endocrine, nutritional and metabolic disease: Secondary | ICD-10-CM | POA: Insufficient documentation

## 2023-12-03 DIAGNOSIS — Z125 Encounter for screening for malignant neoplasm of prostate: Secondary | ICD-10-CM | POA: Insufficient documentation

## 2023-12-03 DIAGNOSIS — Z87898 Personal history of other specified conditions: Secondary | ICD-10-CM | POA: Insufficient documentation

## 2023-12-03 DIAGNOSIS — I10 Essential (primary) hypertension: Secondary | ICD-10-CM

## 2023-12-03 DIAGNOSIS — Z8719 Personal history of other diseases of the digestive system: Secondary | ICD-10-CM | POA: Diagnosis not present

## 2023-12-03 LAB — POCT URINALYSIS DIPSTICK
Bilirubin, UA: NEGATIVE
Blood, UA: NEGATIVE
Glucose, UA: NEGATIVE
Ketones, UA: NEGATIVE
Leukocytes, UA: NEGATIVE
Nitrite, UA: NEGATIVE
Protein, UA: NEGATIVE
Spec Grav, UA: 1.015 (ref 1.010–1.025)
Urobilinogen, UA: 0.2 U/dL
pH, UA: 6 (ref 5.0–8.0)

## 2023-12-03 LAB — T3, FREE: T3, Free: 3.6 pg/mL (ref 2.3–4.2)

## 2023-12-03 LAB — COMPREHENSIVE METABOLIC PANEL
ALT: 15 U/L (ref 0–53)
AST: 21 U/L (ref 0–37)
Albumin: 5 g/dL (ref 3.5–5.2)
Alkaline Phosphatase: 59 U/L (ref 39–117)
BUN: 14 mg/dL (ref 6–23)
CO2: 29 meq/L (ref 19–32)
Calcium: 9.7 mg/dL (ref 8.4–10.5)
Chloride: 100 meq/L (ref 96–112)
Creatinine, Ser: 1.1 mg/dL (ref 0.40–1.50)
GFR: 74.98 mL/min (ref >60.00)
Glucose, Bld: 93 mg/dL (ref 70–99)
Potassium: 5 meq/L (ref 3.5–5.1)
Sodium: 137 meq/L (ref 135–145)
Total Bilirubin: 0.5 mg/dL (ref 0.2–1.2)
Total Protein: 7.3 g/dL (ref 6.0–8.3)

## 2023-12-03 LAB — LIPID PANEL
Cholesterol: 237 mg/dL — ABNORMAL HIGH (ref 0–200)
HDL: 58.4 mg/dL (ref >39.00)
LDL Cholesterol: 147 mg/dL — ABNORMAL HIGH (ref 0–99)
NonHDL: 178.39
Total CHOL/HDL Ratio: 4
Triglycerides: 158 mg/dL — ABNORMAL HIGH (ref 0.0–149.0)
VLDL: 31.6 mg/dL (ref 0.0–40.0)

## 2023-12-03 LAB — POCT GLYCOSYLATED HEMOGLOBIN (HGB A1C)
HbA1c POC (<> result, manual entry): 5.4 % (ref 4.0–5.6)
HbA1c, POC (controlled diabetic range): 5.4 % (ref 0.0–7.0)
HbA1c, POC (prediabetic range): 5.4 % — AB (ref 5.7–6.4)
Hemoglobin A1C: 5.4 % (ref 4.0–5.6)

## 2023-12-03 LAB — PSA: PSA: 0.76 ng/mL (ref 0.10–4.00)

## 2023-12-03 LAB — VITAMIN B12: Vitamin B-12: 170 pg/mL — ABNORMAL LOW (ref 211–911)

## 2023-12-03 LAB — T4, FREE: Free T4: 0.68 ng/dL (ref 0.60–1.60)

## 2023-12-03 LAB — TSH: TSH: 12.15 u[IU]/mL — ABNORMAL HIGH (ref 0.35–5.50)

## 2023-12-03 MED ORDER — LISINOPRIL-HYDROCHLOROTHIAZIDE 10-12.5 MG PO TABS: 1.0000 | ORAL_TABLET | Freq: Every day | ORAL | 3 refills | Status: DC

## 2023-12-03 NOTE — Assessment & Plan Note (Signed)
EKG obtained in office today, showing moderate LVH, likely secondary to uncontrolled HTN -start lisinopril-hydrochlorothiazide daily -purchase BP cuff and monitor home readings -RTC in 2 weeks for recheck

## 2023-12-03 NOTE — Patient Instructions (Addendum)
Your A1C (which tests for diabetes) is normal. You do not have an issue with elevated glucose. Your urine sample was also normal.  Your blood pressure is very elevated, which is likely the cause of your dizziness. Goal BP 120/80. Please purchase a blood pressure cuff and monitor this several times weekly. Bring your home blood pressure cuff to your next office visit. Please start taking lisinopril/hydrochlorothiazide once daily. This is best taken upon awakening in the morning.  We will notify you with the results of your labs once available.  Please return for recheck in 2 weeks.

## 2023-12-03 NOTE — Assessment & Plan Note (Signed)
Gastrointestinal Bleed History History of two bleeding ulcers, no current symptoms. -Check hemoglobin levels to assess for anemia.

## 2023-12-03 NOTE — Assessment & Plan Note (Signed)
Dizziness Episode of dizziness with blurred vision lasting approximately 45 minutes to an hour. No associated headache, tinnitus, neck stiffness, palpitations, chest pain, nausea, or vomiting. Noted to have high blood pressure during the visit. -Order EKG to assess for left ventricular hypertrophy. (LVH noted on prior EKG dated in 2017) -Check blood work including iron levels and kidney function. -Start low-dose blood pressure medication - lisinopril/HCTZ -Recommend purchasing a blood pressure cuff and monitoring blood pressure at home. -Schedule follow-up in 2 weeks to assess blood pressure control and discuss results of blood work.

## 2023-12-03 NOTE — Progress Notes (Signed)
New Patient Office Visit  Subjective:  Patient ID: Stephen Baker, male    DOB: [birth year]  Age: 58 y.o. MRN: [id number]  CC:  Chief Complaint  Patient presents with   Dizziness    New pt. He may not est care here. He states Saturday while he was driving he had a dizziness why while going to restaurant. He check his sugar at a local pharmacy and it was 183. He was fine "Sunday but yesterday the same thing happened for about 5 mins.   Discussed the use of AI software for clinical note transcription with the patient who gave verbal consent to proceed.   HPI Cordell W Bilger presents to establish care.  History of Present Illness The patient presents with intermittent episodes of dizziness.  He has experienced at least two episodes of dizziness over the past few days. The first episode occurred on a Saturday while driving to a restaurant with his wife. He described the sensation as dizziness with blurred vision, similar to feeling 'seasick'. He managed to drive to the restaurant's parking lot, where he waited for the symptoms to subside before entering. During this episode, no headache, tinnitus, neck stiffness, palpitations, chest pain, nausea, or vomiting. The dizziness lasted approximately 45 minutes to an hour and resolved without intervention. The second episode occurred while he was with his mother-in-law and grandson, lasting only a couple of minutes after exiting a car wash. He felt lightheaded but was able to drive home shortly after.  Following the first episode, he checked his blood sugar, which was 183 mg/dL after consuming two sweet teas and a few bites of cheesecake. He has since monitored his blood sugar daily, with readings around 83 to 76 mg/dL, indicating no ongoing issues with blood sugar levels. His A1c was measured at 5.4% today.  He denies taking any prescription medications regularly, though he occasionally uses cetirizine for allergies. He has a history of two bleeding  gastric ulcers, with the last episode occurring around 2017, but he is not currently experiencing any gastrointestinal symptoms. He does not take aspirin or any blood thinners.  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 He consumes an average of two beers per day and drinks two cups of coffee in the morning. His water intake is limited to about two bottles per day. He does not smoke.     Outpatient Encounter Medications as of 12/03/2023  Medication Sig   cetirizine (ZYRTEC) 10 MG tablet Take 10 mg by mouth daily as needed for allergies.   lisinopril-hydrochlorothiazide (ZESTORETIC) 10-12.5 MG tablet Take 1 tablet by mouth daily.   [DISCONTINUED] diphenhydrAMINE-APAP, sleep, (TYLENOL PM EXTRA STRENGTH PO) Take by mouth as needed. (Patient not taking: Reported on 12/03/2023)   [DISCONTINUED] pantoprazole (PROTONIX) 40 MG tablet Take 1 tablet (40 mg total) by mouth 2 (two) times daily before a meal.   No facility-administered encounter medications on file as of 12/03/2023.    Past Medical History:  Diagnosis Date   GERD (gastroesophageal reflux disease)    GI bleed    Hypertension    Kidney stones    Medical history non-contributory    Personal history of kidney stones    Thyroid disease    hyper   Thyroid disease    hyperactive   Ulcer    almost 20 years ago    Past Surgical History:  Procedure Laterality Date   bleeding stomach ulcer     20" 05 or 2006  ESOPHAGOGASTRODUODENOSCOPY (EGD) WITH PROPOFOL N/A 09/07/2016   Procedure: ESOPHAGOGASTRODUODENOSCOPY (EGD) WITH PROPOFOL;  Surgeon: Scot Jun, MD;  Location: [facility] ENDOSCOPY;  Service: Endoscopy;  Laterality: N/A;   OTHER SURGICAL HISTORY     SHOULDER ARTHROSCOPY WITH ROTATOR CUFF REPAIR AND SUBACROMIAL DECOMPRESSION Right 10/08/2013   Procedure: RIGHT SHOULDER ARTHROSCOPY WITH SUBACROMIAL DECOMPRESSION/DISTAL CLAVICLE RESECTION ;  Surgeon: Wyn Forster., MD;  Location: [facility];  Service: Orthopedics;  Laterality: Right;   SHOULDER SURGERY Right    WISDOM TOOTH EXTRACTION      Family History  Problem Relation Age of Onset   Diabetes Mother    Cancer Mother    Heart disease Paternal Grandfather    Stroke Paternal Uncle    Colon cancer Neg Hx    Colon polyps Neg Hx    Esophageal cancer Neg Hx    Rectal cancer Neg Hx    Stomach cancer Neg Hx     Social History   Socioeconomic History   Marital status: Married    Spouse name: Not on file   Number of children: Not on file   Years of education: Not on file   Highest education level: 12th grade  Occupational History   Not on file  Tobacco Use   Smoking status: Never    Passive exposure: Never   Smokeless tobacco: Never  Vaping Use   Vaping status: Never Used  Substance and Sexual Activity   Alcohol use: Yes    Alcohol/week: 10.0 standard drinks of alcohol    Types: 10 Cans of beer per week    Comment: 1 beer per day   Drug use: Never   Sexual activity: Yes  Other Topics Concern   Not on file  Social History Narrative   ** Merged History Encounter **       Social Drivers of Health   Financial Resource Strain: Low Risk  (12/02/2023)   Overall Financial Resource Strain (CARDIA)    Difficulty of Paying Living Expenses: Not hard at all  Food Insecurity: No Food Insecurity (12/02/2023)   Hunger Vital Sign    Worried About Running Out of Food in the Last Year: Never true    Ran Out of Food in the Last Year: Never true  Transportation Needs: No Transportation Needs (12/02/2023)   PRAPARE - Administrator, Civil Service (Medical): No    Lack of Transportation (Non-Medical): No  Physical Activity: Unknown (12/02/2023)   Exercise Vital Sign    Days of Exercise per Week: 0 days    Minutes of Exercise per Session: Not on file  Stress: Stress Concern Present (12/02/2023)   [employer] of Occupational Health - Occupational Stress Questionnaire    Feeling of Stress : To  some extent  Social Connections: Moderately Integrated (12/02/2023)   Social Connection and Isolation Panel [NHANES]    Frequency of Communication with Friends and Family: More than three times a week    Frequency of Social Gatherings with Friends and Family: More than three times a week    Attends Religious Services: More than 4 times per year    Active Member of [employer] or Organizations: No    Attends Engineer, structural: Not on file    Marital Status: Married  Catering manager Violence: Not on file    ROS: as noted in HPI  Objective:  BP (!) 182/115   Pulse 67   Ht 5\' 9"  (1.753 m)   Wt 186 lb 12.8  oz (84.7 kg)   SpO2 99%   BMI 27.59 kg/m   Physical Exam Vitals and nursing note reviewed.  Constitutional:      General: He is not in acute distress.    Appearance: Normal appearance. He is not ill-appearing, toxic-appearing or diaphoretic.  HENT:     Head: Normocephalic and atraumatic.     Right Ear: Tympanic membrane, ear canal and external ear normal. There is no impacted cerumen.     Left Ear: Tympanic membrane, ear canal and external ear normal. There is no impacted cerumen.     Nose: Nose normal.     Mouth/Throat:     Mouth: Mucous membranes are moist.     Pharynx: Oropharynx is clear. No oropharyngeal exudate or posterior oropharyngeal erythema.  Eyes:     General: No visual field deficit or scleral icterus.       Right eye: No discharge.        Left eye: No discharge.     Extraocular Movements: Extraocular movements intact.     Pupils: Pupils are equal, round, and reactive to light.  Neck:     Thyroid: No thyroid mass, thyromegaly or thyroid tenderness.     Vascular: No carotid bruit.  Cardiovascular:     Rate and Rhythm: Normal rate and regular rhythm.     Pulses: Normal pulses.     Heart sounds: Normal heart sounds. No murmur heard.    No friction rub. No gallop.  Pulmonary:     Effort: Pulmonary effort is normal. No respiratory distress.     Breath  sounds: Normal breath sounds. No stridor. No wheezing or rhonchi.  Abdominal:     General: Abdomen is flat. Bowel sounds are normal. There is no distension.     Palpations: Abdomen is soft. There is no mass.     Tenderness: There is no abdominal tenderness. There is no guarding.  Musculoskeletal:     Cervical back: Normal range of motion and neck supple. No rigidity or tenderness.     Right lower leg: No edema.     Left lower leg: No edema.  Lymphadenopathy:     Cervical: No cervical adenopathy.  Skin:    General: Skin is warm and dry.     Coloration: Skin is not jaundiced.     Findings: No bruising, erythema or rash.  Neurological:     General: No focal deficit present.     Mental Status: He is alert and oriented to person, place, and time. Mental status is at baseline.     Cranial Nerves: Cranial nerves 2-12 are intact. No cranial nerve deficit, dysarthria or facial asymmetry.     Sensory: Sensation is intact. No sensory deficit.     Motor: Motor function is intact. No weakness, tremor, atrophy, abnormal muscle tone or pronator drift.     Coordination: Coordination is intact. Romberg sign negative. Coordination normal.     Gait: Gait is intact. Gait normal.     Deep Tendon Reflexes: Reflexes are normal and symmetric.  Psychiatric:        Mood and Affect: Mood normal.        Behavior: Behavior normal.    EKG: normal sinus rhythm, LVH noted, worsened since EKG in 2017.   Lab Results  Component Value Date   COLORU yellow 12/03/2023   CLARITYU clear 12/03/2023   GLUCOSEUR Negative 12/03/2023   BILIRUBINUR negative 12/03/2023   KETONESU negative 12/03/2023   SPECGRAV 1.015 12/03/2023   RBCUR negative 12/03/2023  PHUR 6.0 12/03/2023   PROTEINUR Negative 12/03/2023   UROBILINOGEN 0.2 12/03/2023   LEUKOCYTESUR Negative 12/03/2023    Last CBC Lab Results  Component Value Date   WBC 8.3 11/12/2016   HGB 12.9 (L) 11/12/2016   HCT 38.4 (L) 11/12/2016   MCV 80.7 11/12/2016    MCH 27.2 11/12/2016   RDW 15.0 (H) 11/12/2016   PLT 321 11/12/2016   Last metabolic panel Lab Results  Component Value Date   GLUCOSE 121 (H) 11/12/2016   NA 135 11/12/2016   K 4.3 11/12/2016   CL 102 11/12/2016   CO2 26 11/12/2016   BUN 14 11/12/2016   CREATININE 1.28 (H) 11/12/2016   GFRNONAA >60 11/12/2016   CALCIUM 9.4 11/12/2016   PROT 8.0 11/12/2016   ALBUMIN 4.5 11/12/2016   BILITOT 0.6 11/12/2016   ALKPHOS 67 11/12/2016   AST 21 11/12/2016   ALT 12 (L) 11/12/2016   ANIONGAP 7 11/12/2016   Last lipids No results found for: "CHOL", "HDL", "LDLCALC", "LDLDIRECT", "TRIG", "CHOLHDL" Last hemoglobin A1c Lab Results  Component Value Date   HGBA1C 5.4 12/03/2023   HGBA1C 5.4 12/03/2023   HGBA1C 5.4 (A) 12/03/2023   HGBA1C 5.4 12/03/2023   Last thyroid functions No results found for: "TSH", "T3TOTAL", "T4TOTAL", "THYROIDAB" Last vitamin D No results found for: "25OHVITD2", "25OHVITD3", "VD25OH" Last vitamin B12 and Folate No results found for: "VITAMINB12", "FOLATE"    Assessment & Plan:  Dizziness -     POCT urinalysis dipstick -     POCT glycosylated hemoglobin (Hb A1C) -     EKG 12-Lead -     CBC with Differential/Platelet -     TSH -     Lipid panel -     Comprehensive metabolic panel -     Iron, TIBC and Ferritin Panel -     Vitamin B12 -     Lisinopril-hydroCHLOROthiazide; Take 1 tablet by mouth daily.  Dispense: 90 tablet; Refill: 3  History of GI bleed -     CBC with Differential/Platelet -     Comprehensive metabolic panel -     Iron, TIBC and Ferritin Panel -     Vitamin B12  Hypertension, unspecified type -     EKG 12-Lead -     CBC with Differential/Platelet -     TSH -     Lipid panel -     Comprehensive metabolic panel -     Lisinopril-hydroCHLOROthiazide; Take 1 tablet by mouth daily.  Dispense: 90 tablet; Refill: 3  Prostate cancer screening -     PSA  Personal history of hyperthyroidism -     TSH -     T3, free -     T4,  free    Return in about 2 weeks (around 12/17/2023).   Maretta Bees, PA

## 2023-12-04 LAB — CBC WITH DIFFERENTIAL/PLATELET
Basophils Absolute: 0.1 10*3/uL (ref 0.0–0.1)
Basophils Relative: 1.1 % (ref 0.0–3.0)
Eosinophils Absolute: 0.1 10*3/uL (ref 0.0–0.7)
Eosinophils Relative: 1.6 % (ref 0.0–5.0)
HCT: 50.7 % (ref 39.0–52.0)
Hemoglobin: 16.8 g/dL (ref 13.0–17.0)
Lymphocytes Relative: 24 % (ref 12.0–46.0)
Lymphs Abs: 1.7 10*3/uL (ref 0.7–4.0)
MCHC: 33.1 g/dL (ref 30.0–36.0)
MCV: 96.7 fL (ref 78.0–100.0)
Monocytes Absolute: 0.4 10*3/uL (ref 0.1–1.0)
Monocytes Relative: 5.3 % (ref 3.0–12.0)
Neutro Abs: 4.7 10*3/uL (ref 1.4–7.7)
Neutrophils Relative %: 68 % (ref 43.0–77.0)
Platelets: 324 10*3/uL (ref 150.0–400.0)
RBC: 5.24 Mil/uL (ref 4.22–5.81)
RDW: 12.8 % (ref 11.5–15.5)
WBC: 7 10*3/uL (ref 4.0–10.5)

## 2023-12-04 LAB — IRON,TIBC AND FERRITIN PANEL
%SAT: 33 % (ref 20–48)
Ferritin: 42 ng/mL (ref 38–380)
Iron: 141 ug/dL (ref 50–180)
TIBC: 432 ug/dL — ABNORMAL HIGH (ref 250–425)

## 2023-12-17 ENCOUNTER — Encounter: Payer: Self-pay | Admitting: Urgent Care

## 2023-12-17 ENCOUNTER — Ambulatory Visit: Payer: 59 | Admitting: Urgent Care

## 2023-12-17 VITALS — BP 134/93 | HR 70 | Wt 185.0 lb

## 2023-12-17 DIAGNOSIS — E538 Deficiency of other specified B group vitamins: Secondary | ICD-10-CM

## 2023-12-17 DIAGNOSIS — E890 Postprocedural hypothyroidism: Secondary | ICD-10-CM | POA: Diagnosis not present

## 2023-12-17 DIAGNOSIS — I10 Essential (primary) hypertension: Secondary | ICD-10-CM

## 2023-12-17 DIAGNOSIS — E782 Mixed hyperlipidemia: Secondary | ICD-10-CM

## 2023-12-17 MED ORDER — LEVOTHYROXINE SODIUM 50 MCG PO TABS: 50.0000 ug | ORAL_TABLET | Freq: Every day | ORAL | 0 refills | Status: DC

## 2023-12-17 NOTE — Progress Notes (Signed)
Established Patient Office Visit  Subjective:  Patient ID: Stephen Baker, male    DOB: 1966/11/05  Age: 58 y.o. MRN: [id number]  Chief Complaint  Patient presents with   Follow-up    2 week follow up. He has been monitering his bp at home and its been good. He has been taking B 12 at night but couldn't sleep so he started taking it in the mornings.    HPI Discussed the use of AI scribe software for clinical note transcription with the patient, who gave verbal consent to proceed.  History of Present Illness   Stephen Baker is a 58 year old male with a history of hyperthyroidism and hypertension who presents for follow-up on thyroid function and blood pressure management.  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 Recent blood work shows an elevated TSH level of 12, indicating potential hypothyroidism. He experiences fatigue and sluggishness, which he attributes to poor sleep and diet.  He is currently taking lisinopril-hydrochlorothiazide for blood pressure management, which he has recently reduced to half a tablet due to feeling unwell. His home blood pressure readings have been stable, averaging around 128/84. He reports improved blood pressure since adjusting his medication. No further major dizzy episodes have occurred since the last visit two weeks ago, though he experiences occasional dizziness when moving quickly but denies any severe episodes requiring him to pull over while driving.  He has a calculated ASCVD risk score of 8.2% and is considering dietary changes to manage his cholesterol, which is currently at 237 mg/dL.  He takes sublingual vitamin B12 supplements and reports improved blood pressure since adjusting his medication. He has a history of bleeding ulcers, with the first occurring approximately 25 years ago and  another in 2017, which he associates with stress from his trucking business.  He previously worked in [employer] and attributes stress from this job to his past ulcers. No smoking.       Patient Active Problem List   Diagnosis Date Noted   Dizziness 12/03/2023   History of GI bleed 12/03/2023   Hypertension 12/03/2023   Prostate cancer screening 12/03/2023   Personal history of hyperthyroidism 12/03/2023   H/O ulcer disease 12/03/2023   GI bleed 09/07/2016   Syncope 09/07/2016   Past Medical History:  Diagnosis Date   GERD (gastroesophageal reflux disease)    GI bleed    Hypertension    Kidney stones    Medical history non-contributory    Personal history of kidney stones    Thyroid disease    hyper   Thyroid disease    hyperactive   Ulcer    almost 20 years ago   Past Surgical History:  Procedure Laterality Date   bleeding stomach ulcer     2005 or 2006   ESOPHAGOGASTRODUODENOSCOPY (EGD) WITH PROPOFOL N/A 09/07/2016   Procedure: ESOPHAGOGASTRODUODENOSCOPY (EGD) WITH PROPOFOL;  Surgeon: Scot Jun, MD;  Location: [facility];  Service: Endoscopy;  Laterality: N/A;   OTHER SURGICAL HISTORY     SHOULDER ARTHROSCOPY WITH ROTATOR CUFF REPAIR AND SUBACROMIAL DECOMPRESSION Right 10/08/2013   Procedure: RIGHT SHOULDER ARTHROSCOPY WITH SUBACROMIAL DECOMPRESSION/DISTAL CLAVICLE RESECTION ;  Surgeon: Wyn Forster., MD;  Location: [facility];  Service: Orthopedics;  Laterality: Right;   SHOULDER SURGERY Right    WISDOM TOOTH EXTRACTION  Social History   Tobacco Use   Smoking status: Never    Passive exposure: Never   Smokeless tobacco: Never  Vaping Use   Vaping status: Never Used  Substance Use Topics   Alcohol use: Yes    Alcohol/week: 10.0 standard drinks of alcohol    Types: 10 Cans of beer Stephen week    Comment: 1 beer Stephen day   Drug use: Never      ROS: as noted in HPI  Objective:     BP (!) 134/93   Pulse 70   Wt  185 lb (83.9 kg)   SpO2 99%   BMI 27.32 kg/m  BP Readings from Last 3 Encounters:  12/17/23 (!) 134/93  12/03/23 (!) 182/115  01/23/22 110/80   Wt Readings from Last 3 Encounters:  12/17/23 185 lb (83.9 kg)  12/03/23 186 lb 12.8 oz (84.7 kg)  01/23/22 165 lb (74.8 kg)      Physical Exam Vitals and nursing note reviewed.  Constitutional:      General: He is not in acute distress.    Appearance: Normal appearance. He is not ill-appearing, toxic-appearing or diaphoretic.  HENT:     Head: Normocephalic and atraumatic.     Right Ear: External ear normal.     Left Ear: External ear normal.     Nose: Nose normal.  Eyes:     General: No scleral icterus.    Pupils: Pupils are equal, round, and reactive to light.  Cardiovascular:     Rate and Rhythm: Normal rate and regular rhythm.     Heart sounds: No murmur heard.    No friction rub. No gallop.  Pulmonary:     Effort: Pulmonary effort is normal. No respiratory distress.     Breath sounds: Normal breath sounds. No stridor. No wheezing, rhonchi or rales.  Musculoskeletal:     Cervical back: No rigidity.     Right lower leg: No edema.     Left lower leg: No edema.  Lymphadenopathy:     Cervical: No cervical adenopathy.  Skin:    General: Skin is warm and dry.     Findings: No erythema or rash.  Neurological:     General: No focal deficit present.     Mental Status: He is alert and oriented to person, place, and time.      No results found for any visits on 12/17/23.  Last CBC Lab Results  Component Value Date   WBC 7.0 12/03/2023   HGB 16.8 12/03/2023   HCT 50.7 12/03/2023   MCV 96.7 12/03/2023   MCH 27.2 11/12/2016   RDW 12.8 12/03/2023   PLT 324.0 12/03/2023   Last metabolic panel Lab Results  Component Value Date   GLUCOSE 93 12/03/2023   NA 137 12/03/2023   K 5.0 12/03/2023   CL 100 12/03/2023   CO2 29 12/03/2023   BUN 14 12/03/2023   CREATININE 1.10 12/03/2023   GFR 74.98 12/03/2023   CALCIUM 9.7  12/03/2023   PROT 7.3 12/03/2023   ALBUMIN 5.0 12/03/2023   BILITOT 0.5 12/03/2023   ALKPHOS 59 12/03/2023   AST 21 12/03/2023   ALT 15 12/03/2023   ANIONGAP 7 11/12/2016   Last lipids Lab Results  Component Value Date   CHOL 237 (H) 12/03/2023   HDL 58.40 12/03/2023   LDLCALC 147 (H) 12/03/2023   TRIG 158.0 (H) 12/03/2023   CHOLHDL 4 12/03/2023   Last hemoglobin A1c Lab Results  Component Value Date  HGBA1C 5.4 12/03/2023   HGBA1C 5.4 12/03/2023   HGBA1C 5.4 (A) 12/03/2023   HGBA1C 5.4 12/03/2023   Last thyroid functions Lab Results  Component Value Date   TSH 12.15 (H) 12/03/2023   Last vitamin D No results found for: "25OHVITD2", "25OHVITD3", "VD25OH" Last vitamin B12 and Folate Lab Results  Component Value Date   VITAMINB12 170 (L) 12/03/2023      The 10-year ASCVD risk score (Arnett DK, et al., 2019) is: 8.2%  Assessment & Plan:  Postablative hypothyroidism -     Levothyroxine Sodium; Take 1 tablet (50 mcg total) by mouth daily.  Dispense: 90 tablet; Refill: 0 -     T4, free; Future -     T3; Future -     TSH; Future  Mixed hyperlipidemia -     Lipid panel; Future  B12 deficiency -     Vitamin B12; Future  Hypertension, unspecified type  Assessment and Plan    Hypothyroidism Elevated TSH with normal T3 and T4, suggestive of subclinical hypothyroidism. History of radioiodine ablation for hyperthyroidism and subsequent discontinuation of thyroid replacement therapy. Reports of fatigue and lethargy. -Start Levothyroxine daily, with potential titration based on response. -Recheck TSH in 6 weeks (end of March 2025).  Hyperlipidemia ASCVD risk score of 8.2%, slightly above the 7.5% threshold for treatment. Patient is a non-smoker. -Start over-the-counter fish oil. -Encourage adherence to a Mediterranean diet. -Recheck cholesterol in 6 months.  Vitamin B12 deficiency Currently on sublingual B12 supplementation. -Continue current  regimen. -Recheck B12 level in 6 weeks (end of March 2025).  Hypertension Blood pressure readings at home averaging 128/84 on half a tablet of Lisinopril-Hydrochlorothiazide daily. -Continue current regimen. -Monitor blood pressure at home and notify office if readings consistently drop to 110 systolic or below.  Follow-up in 6 weeks to review lab results.       Return for FASTING LABS ONLY in about 6 weeks  Return in about 3 months (around 03/16/2024).   Maretta Bees, PA

## 2023-12-17 NOTE — Patient Instructions (Signed)
Come back for FASTING labs anytime after 02/03/24. We will check your B12, thyroid and cholesterol at this time.  Continue to monitor your blood pressure. If you start getting low readings, let me know and we will DC your hydrochlorothiazide. Continue to take 1/2 tab daily.   Return for in office recheck in 3 months.

## 2024-01-28 ENCOUNTER — Ambulatory Visit: Payer: 59 | Admitting: Family

## 2024-01-30 ENCOUNTER — Ambulatory Visit: Payer: 59 | Admitting: Urgent Care

## 2024-02-04 ENCOUNTER — Other Ambulatory Visit (INDEPENDENT_AMBULATORY_CARE_PROVIDER_SITE_OTHER): Payer: 59

## 2024-02-04 DIAGNOSIS — E538 Deficiency of other specified B group vitamins: Secondary | ICD-10-CM | POA: Diagnosis not present

## 2024-02-04 DIAGNOSIS — E782 Mixed hyperlipidemia: Secondary | ICD-10-CM

## 2024-02-04 DIAGNOSIS — E890 Postprocedural hypothyroidism: Secondary | ICD-10-CM | POA: Diagnosis not present

## 2024-02-04 LAB — LIPID PANEL
Cholesterol: 234 mg/dL — ABNORMAL HIGH (ref 0–200)
HDL: 56.9 mg/dL (ref >39.00)
LDL Cholesterol: 161 mg/dL — ABNORMAL HIGH (ref 0–99)
NonHDL: 176.71
Total CHOL/HDL Ratio: 4
Triglycerides: 79 mg/dL (ref 0.0–149.0)
VLDL: 15.8 mg/dL (ref 0.0–40.0)

## 2024-02-04 LAB — T4, FREE: Free T4: 0.84 ng/dL (ref 0.60–1.60)

## 2024-02-04 LAB — VITAMIN B12: Vitamin B-12: 375 pg/mL (ref 211–911)

## 2024-02-04 LAB — TSH: TSH: 5.81 u[IU]/mL — ABNORMAL HIGH (ref 0.35–5.50)

## 2024-02-05 ENCOUNTER — Other Ambulatory Visit: Payer: Self-pay | Admitting: Urgent Care

## 2024-02-05 ENCOUNTER — Encounter: Payer: Self-pay | Admitting: Urgent Care

## 2024-02-05 DIAGNOSIS — E782 Mixed hyperlipidemia: Secondary | ICD-10-CM

## 2024-02-05 LAB — T3: T3, Total: 100 ng/dL (ref 76–181)

## 2024-02-05 MED ORDER — PRAVASTATIN SODIUM 20 MG PO TABS: 20.0000 mg | ORAL_TABLET | Freq: Every day | ORAL | 1 refills | Status: DC

## 2024-02-05 NOTE — Telephone Encounter (Signed)
 No further action needed.

## 2024-02-07 ENCOUNTER — Other Ambulatory Visit: Payer: Self-pay

## 2024-02-07 DIAGNOSIS — E890 Postprocedural hypothyroidism: Secondary | ICD-10-CM

## 2024-02-07 MED ORDER — LEVOTHYROXINE SODIUM 50 MCG PO TABS: 50.0000 ug | ORAL_TABLET | Freq: Every day | ORAL | 0 refills | Status: DC

## 2024-03-16 ENCOUNTER — Encounter: Payer: Self-pay | Admitting: Urgent Care

## 2024-03-16 ENCOUNTER — Ambulatory Visit: Payer: 59 | Admitting: Urgent Care

## 2024-03-16 VITALS — BP 138/92 | HR 71 | Wt 182.2 lb

## 2024-03-16 DIAGNOSIS — Z8719 Personal history of other diseases of the digestive system: Secondary | ICD-10-CM | POA: Diagnosis not present

## 2024-03-16 DIAGNOSIS — M25511 Pain in right shoulder: Secondary | ICD-10-CM | POA: Diagnosis not present

## 2024-03-16 DIAGNOSIS — G8929 Other chronic pain: Secondary | ICD-10-CM | POA: Diagnosis not present

## 2024-03-16 DIAGNOSIS — E890 Postprocedural hypothyroidism: Secondary | ICD-10-CM | POA: Diagnosis not present

## 2024-03-16 DIAGNOSIS — E538 Deficiency of other specified B group vitamins: Secondary | ICD-10-CM | POA: Insufficient documentation

## 2024-03-16 DIAGNOSIS — R42 Dizziness and giddiness: Secondary | ICD-10-CM

## 2024-03-16 DIAGNOSIS — E782 Mixed hyperlipidemia: Secondary | ICD-10-CM | POA: Insufficient documentation

## 2024-03-16 DIAGNOSIS — I10 Essential (primary) hypertension: Secondary | ICD-10-CM | POA: Diagnosis not present

## 2024-03-16 MED ORDER — DICLOFENAC SODIUM 1 % EX GEL: 2.0000 g | Freq: Four times a day (QID) | CUTANEOUS | 11 refills | Status: AC

## 2024-03-16 NOTE — Patient Instructions (Addendum)
 Double check your home medications, and notify me if you are doing anything different:  Lisinopril -hydrochlorothiazide  - prescribed dose is 10-12.5mg . I am under the impression you are taking just half. Cetirizine 10mg  - this is OTC for allergies Pravastatin  20mg  - taken at night for cholesterol Levothyroxine  50mcg - please start taking 75mcg (1.5 tabs) two days a week, and 50mcg (one pill) the other days. OTC B12 1000mcg - this should be a sublingual tablet.  Please let me know if you are doing anything different than the above.   Follow up in 6-7 weeks at the new office location:  [facility] [address] [place], [place] [address] Phone: [phone]    Please start using the topical diclofenac up to four times daily on your right shoulder. Please follow up with physical therapy.

## 2024-03-16 NOTE — Progress Notes (Signed)
 Established Patient Office Visit  Subjective:  Patient ID: ELIASON Baker, male    DOB: 1966-06-07  Age: 58 y.o. MRN: [id number]  Chief Complaint  Patient presents with   Follow-up    3 month follow up.    HPI  Discussed the use of AI scribe software for clinical note transcription with the patient, who gave verbal consent to proceed.  History of Present Illness   Stephen Baker is a 58 year old male with hypertension and hypothyroidism who presents for follow-up of blood pressure and thyroid  management.  His blood pressure at home has been in the 130s/90s range, with occasional readings in the 140s. He is taking half of the prescribed dose of lisinopril  hydrochlorothiazide  10-12.5 mg daily due to feeling unwell on the full dose. The last office blood pressure was 138/92. No significant symptoms related to blood pressure are reported.  Regarding hypothyroidism, his TSH was initially 12.15 three months ago and decreased to 5.81 after restarting levothyroxine  at 50 mcg. He has not started the recommended dose adjustment to 75 mcg on weekends. He reports occasional dizziness, described as difficulty focusing, particularly behind the eyes, without spinning sensation, ringing in the ears, or palpitations.  He is also taking pravastatin , which was prescribed after his last cholesterol check showed elevated levels. He takes three medications in the morning and one at night, including an antihistamine (cetirizine) and a sublingual B12 supplement, which he has been cutting in half.  He describes chronic right shoulder weakness and tingling, attributed to past rodeo activities and a previous surgery in 2014 for distal clavicle resection. He experiences no constant pain but reports weakness and tingling, especially when using the arm or throwing. He has not pursued further treatment since the surgery.      Patient Active Problem List   Diagnosis Date Noted   Postablative hypothyroidism 03/16/2024    Chronic right shoulder pain 03/16/2024   B12 deficiency 03/16/2024   Mixed hyperlipidemia 03/16/2024   Dizziness 12/03/2023   History of GI bleed 12/03/2023   Hypertension 12/03/2023   Prostate cancer screening 12/03/2023   Personal history of hyperthyroidism 12/03/2023   H/O ulcer disease 12/03/2023   GI bleed 09/07/2016   Syncope 09/07/2016   Past Medical History:  Diagnosis Date   GERD (gastroesophageal reflux disease)    GI bleed    Hypertension    Kidney stones    Medical history non-contributory    Personal history of kidney stones    Thyroid  disease    hyper   Thyroid  disease    hyperactive   Ulcer    almost 20 years ago   Past Surgical History:  Procedure Laterality Date   bleeding stomach ulcer     2005 or 2006   ESOPHAGOGASTRODUODENOSCOPY (EGD) WITH PROPOFOL  N/A 09/07/2016   Procedure: ESOPHAGOGASTRODUODENOSCOPY (EGD) WITH PROPOFOL ;  Surgeon: Cassie Click, MD;  Location: [facility];  Service: Endoscopy;  Laterality: N/A;   OTHER SURGICAL HISTORY     SHOULDER ARTHROSCOPY WITH ROTATOR CUFF REPAIR AND SUBACROMIAL DECOMPRESSION Right 10/08/2013   Procedure: RIGHT SHOULDER ARTHROSCOPY WITH SUBACROMIAL DECOMPRESSION/DISTAL CLAVICLE RESECTION ;  Surgeon: Amelie Baize., MD;  Location: [facility];  Service: Orthopedics;  Laterality: Right;   SHOULDER SURGERY Right    WISDOM TOOTH EXTRACTION     Social History   Tobacco Use   Smoking status: Never    Passive exposure: Never   Smokeless tobacco: Never  Vaping Use   Vaping status: Never Used  Substance Use Topics   Alcohol use: Yes    Alcohol/week: 10.0 standard drinks of alcohol    Types: 10 Cans of beer per week    Comment: 1 beer per day   Drug use: Never      ROS: as noted in HPI  Objective:     BP (!) 138/92   Pulse 71   Wt 182 lb 3.2 oz (82.6 kg)   SpO2 99%   BMI 26.91 kg/m  BP Readings from Last 3 Encounters:  03/16/24 (!) 138/92  12/17/23 (!) 134/93  12/03/23  (!) 182/115   Wt Readings from Last 3 Encounters:  03/16/24 182 lb 3.2 oz (82.6 kg)  12/17/23 185 lb (83.9 kg)  12/03/23 186 lb 12.8 oz (84.7 kg)      Physical Exam Vitals and nursing note reviewed.  Constitutional:      General: He is not in acute distress.    Appearance: Normal appearance. He is not ill-appearing, toxic-appearing or diaphoretic.  HENT:     Head: Normocephalic and atraumatic.     Right Ear: External ear normal.     Left Ear: External ear normal.     Nose: Nose normal.  Eyes:     General: No scleral icterus.    Pupils: Pupils are equal, round, and reactive to light.  Cardiovascular:     Rate and Rhythm: Normal rate and regular rhythm.     Heart sounds: No murmur heard. Pulmonary:     Effort: Pulmonary effort is normal. No respiratory distress.     Breath sounds: Normal breath sounds. No stridor. No wheezing, rhonchi or rales.  Musculoskeletal:     Right shoulder: Decreased strength. Normal pulse.     Right upper arm: Normal.       Arms:  Skin:    General: Skin is warm and dry.     Findings: No erythema or rash.  Neurological:     General: No focal deficit present.     Mental Status: He is alert and oriented to person, place, and time.      No results found for any visits on 03/16/24.  Last CBC Lab Results  Component Value Date   WBC 7.0 12/03/2023   HGB 16.8 12/03/2023   HCT 50.7 12/03/2023   MCV 96.7 12/03/2023   MCH 27.2 11/12/2016   RDW 12.8 12/03/2023   PLT 324.0 12/03/2023   Last metabolic panel Lab Results  Component Value Date   GLUCOSE 93 12/03/2023   NA 137 12/03/2023   K 5.0 12/03/2023   CL 100 12/03/2023   CO2 29 12/03/2023   BUN 14 12/03/2023   CREATININE 1.10 12/03/2023   GFR 74.98 12/03/2023   CALCIUM 9.7 12/03/2023   PROT 7.3 12/03/2023   ALBUMIN 5.0 12/03/2023   BILITOT 0.5 12/03/2023   ALKPHOS 59 12/03/2023   AST 21 12/03/2023   ALT 15 12/03/2023   ANIONGAP 7 11/12/2016   Last lipids Lab Results  Component  Value Date   CHOL 234 (H) 02/04/2024   HDL 56.90 02/04/2024   LDLCALC 161 (H) 02/04/2024   TRIG 79.0 02/04/2024   CHOLHDL 4 02/04/2024   Last hemoglobin A1c Lab Results  Component Value Date   HGBA1C 5.4 12/03/2023   HGBA1C 5.4 12/03/2023   HGBA1C 5.4 (A) 12/03/2023   HGBA1C 5.4 12/03/2023   Last thyroid  functions Lab Results  Component Value Date   TSH 5.81 (H) 02/04/2024   T3TOTAL 100 02/04/2024   Last vitamin D No results  found for: "25OHVITD2", "25OHVITD3", "VD25OH" Last vitamin B12 and Folate Lab Results  Component Value Date   VITAMINB12 375 02/04/2024      The 10-year ASCVD risk score (Arnett DK, et al., 2019) is: 8.7%  Assessment & Plan:  Chronic right shoulder pain -     Diclofenac Sodium; Apply 2 g topically 4 (four) times daily. To affected joint.  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 Symptoms include weakness and tingling with overhead activities. Physical therapy recommended. Avoid oral anti-inflammatories due to prior GI bleed. - Prescribe topical diclofenac gel, apply 2-4 times daily. - Refer to physical therapy for evaluation and treatment. - Avoid oral anti-inflammatories.  Hypertension Home blood pressure readings in 130s/80s, acceptable. In-office reading 138/92. Currently on half dose of lisinopril  hydrochlorothiazide  due to side effects. Home readings more reliable. - Continue current dose of lisinopril  hydrochlorothiazide  (half tablet daily). - Consider increasing to full dose if home readings trend upward.  Hypothyroidism TSH decreased from 12.15 to 5.81 after restarting levothyroxine . Current TSH not at goal. Non-compliance with dosing adjustment noted.  Dose adjustment needed for optimal thyroid  function. - Increase levothyroxine  to 75 mcg two days a week and 50 mcg the other five days. - Recheck thyroid  function in six weeks.  Hyperlipidemia Pravastatin  started recently due to increased lipid levels. Too early to recheck lipid panel. No side effects reported. - Continue pravastatin  as prescribed. - Recheck lipid panel in approximately six weeks.  Vitamin B12 deficiency B12 levels low but within normal range. Taking sublingual B12 1000 mcg daily with some response. Dizziness reported, possibly related to low B12. - Continue sublingual B12 1000 mcg daily.         Return in about 6 weeks (around 04/27/2024).   Mandy Second, PA

## 2024-04-09 ENCOUNTER — Other Ambulatory Visit: Payer: Self-pay

## 2024-04-09 DIAGNOSIS — E890 Postprocedural hypothyroidism: Secondary | ICD-10-CM

## 2024-04-09 MED ORDER — LEVOTHYROXINE SODIUM 50 MCG PO TABS: 50.0000 ug | ORAL_TABLET | Freq: Every day | ORAL | 0 refills | Status: DC

## 2024-04-15 NOTE — Therapy (Signed)
 OUTPATIENT PHYSICAL THERAPY SHOULDER EVALUATION   Patient Name: Stephen Baker MRN: [id number] DOB:05-Apr-1966, 58 y.o., male Today's Date: 04/16/2024  END OF SESSION:  PT End of Session - 04/16/24 1010     Visit Number 1    Date for PT Re-Evaluation 07/16/24    Authorization Type [employer]    PT Start Time 1015    PT Stop Time 1050    PT Time Calculation (min) 35 min          Past Medical History:  Diagnosis Date   GERD (gastroesophageal reflux disease)    GI bleed    Hypertension    Kidney stones    Medical history non-contributory    Personal history of kidney stones    Thyroid  disease    hyper   Thyroid  disease    hyperactive   Ulcer    almost 20 years ago   Past Surgical History:  Procedure Laterality Date   bleeding stomach ulcer     2005 or 2006   ESOPHAGOGASTRODUODENOSCOPY (EGD) WITH PROPOFOL  N/A 09/07/2016   Procedure: ESOPHAGOGASTRODUODENOSCOPY (EGD) WITH PROPOFOL ;  Surgeon: Cassie Click, MD;  Location: [facility] ENDOSCOPY;  Service: Endoscopy;  Laterality: N/A;   OTHER SURGICAL HISTORY     SHOULDER ARTHROSCOPY WITH ROTATOR CUFF REPAIR AND SUBACROMIAL DECOMPRESSION Right 10/08/2013   Procedure: RIGHT SHOULDER ARTHROSCOPY WITH SUBACROMIAL DECOMPRESSION/DISTAL CLAVICLE RESECTION ;  Surgeon: Amelie Baize., MD;  Location: [facility];  Service: Orthopedics;  Laterality: Right;   SHOULDER SURGERY Right    WISDOM TOOTH EXTRACTION     Patient Active Problem List   Diagnosis Date Noted   Postablative hypothyroidism 03/16/2024   Chronic right shoulder pain 03/16/2024   B12 deficiency 03/16/2024   Mixed hyperlipidemia 03/16/2024   Dizziness 12/03/2023   History of GI bleed 12/03/2023   Hypertension 12/03/2023   Prostate cancer screening 12/03/2023   Personal history of hyperthyroidism 12/03/2023   H/O ulcer disease 12/03/2023   GI bleed 09/07/2016   Syncope 09/07/2016    PCP: Corita Diego  REFERRING PROVIDER: Corita Diego  REFERRING  DIAG:  [id number] (ICD-10-CM) - Chronic right shoulder pain      THERAPY DIAG:  Chronic right shoulder pain  Muscle weakness (generalized)  Rationale for Evaluation and Treatment: Rehabilitation  ONSET DATE: been going on for years   SUBJECTIVE:                                                                                                                                                                                      SUBJECTIVE STATEMENT: My arm seems to be asleep all the time. The surgery I had in 2014  didn't do anything to help. I don't have a lot of strength in it with certain movements.    PERTINENT HISTORY: He describes chronic right shoulder weakness and tingling, attributed to past rodeo activities and a previous surgery in 2014 for distal clavicle resection. He experiences no constant pain but reports weakness and tingling, especially when using the arm or throwing. He has not pursued further treatment since the surgery.   PAIN:  Are you having pain? Yes: NPRS scale: 6-7/10 Pain location: R shoulder  Pain description: numbness and tingling, constant Aggravating factors: overhead reaching, reaching across or out to the side  Relieving factors: I haven't really tried anything   PRECAUTIONS: None  RED FLAGS: None   WEIGHT BEARING RESTRICTIONS: No  FALLS:  Has patient fallen in last 6 months? No  LIVING ENVIRONMENT: Lives with: lives with their family Lives in: House/apartment  OCCUPATION: Truck driver   PLOF: Independent  PATIENT GOALS:nothing specific, just see what we can do to help   NEXT MD VISIT:   OBJECTIVE:  Note: Objective measures were completed at Evaluation unless otherwise noted.  DIAGNOSTIC FINDINGS:  No recent imaging  PATIENT SURVEYS:  Quick Dash 29.5  COGNITION: Overall cognitive status: Within functional limits for tasks assessed     SENSATION: WFL  UPPER EXTREMITY ROM: WFL but pain with end ranges on R side     UPPER EXTREMITY MMT:  MMT Right eval Left eval  Shoulder flexion 4-   Shoulder extension    Shoulder abduction 3+ with pain   Shoulder adduction 4- with pain   Shoulder internal rotation 5   Shoulder external rotation 5   Middle trapezius    Lower trapezius    Elbow flexion    Elbow extension    Wrist flexion    Wrist extension    Wrist ulnar deviation    Wrist radial deviation    Wrist pronation    Wrist supination    Grip strength (lbs)    (Blank rows = not tested)  SHOULDER SPECIAL TESTS: Impingement tests: Neer impingement test: negative and Hawkins/Kennedy impingement test: positive   JOINT MOBILITY TESTING:  Full PROM with some pain   PALPATION:  Some tenderness to anterior shoulder at [facility] joint                                                                                                                              TREATMENT DATE: EVAL 04/16/24   PATIENT EDUCATION: Education details: POC, HEP, anatomy Person educated: Patient Education method: Explanation and Demonstration Education comprehension: verbalized understanding and returned demonstration  HOME EXERCISE PROGRAM: Access Code: [email or web address] URL: [email or web address] Date: 04/16/2024 Prepared by: Donavon Fudge  Exercises - Shoulder External Rotation and Scapular Retraction with Resistance  - 2 x daily - 7 x weekly - 2 sets - 10 reps - Standing Shoulder Horizontal Abduction with Resistance  - 2 x daily - 7 x weekly - 2 sets - 10 reps -  Doorway Pec Stretch at 90 Degrees Abduction  - 2 x daily - 7 x weekly - 2 reps - 30 hold - Chest Fly with Resistance  - 2 x daily - 7 x weekly - 10 reps - Standing Single Arm Shoulder Abduction with Resistance  - 2 x daily - 7 x weekly - 2 sets - 10 reps - Standing Shoulder Flexion with Resistance  - 2 x daily - 7 x weekly - 2 sets - 10 reps  ASSESSMENT:  CLINICAL IMPRESSION: Patient is a 58 y.o. male who was seen today for physical therapy  evaluation and treatment for chronic R shoulder pain primarily to the R pec and R bicep. He has some AC joint OA and possible impingement and/or subacromial bursitis. He has trouble primarily with abduction, reaching across his body and above the head work. Patient has some weakness in his R shoulder and reports that anything overhead is difficult. He states he has more numbness and tingling than pain and it has been going on for over 10 years. 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 He will benefit from skilled PT to work on strengthening for posture and shoulder to increase ease with work activities and ADLs.     OBJECTIVE IMPAIRMENTS: decreased ROM, decreased strength, impaired UE functional use, and pain.   ACTIVITY LIMITATIONS: carrying, lifting, reach over head, and hygiene/grooming  PARTICIPATION LIMITATIONS: occupation and yard work  PERSONAL FACTORS: Time since onset of injury/illness/exacerbation are also affecting patient's functional outcome.   REHAB POTENTIAL: Good  CLINICAL DECISION MAKING: Stable/uncomplicated  EVALUATION COMPLEXITY: Low   GOALS: Goals reviewed with patient? Yes  SHORT TERM GOALS: Target date: 06/04/24  Patient will be independent with initial HEP.  Baseline: given on 04/16/24 Goal status: INITIAL   LONG TERM GOALS: Target date: 07/16/24  Patient will be independent with advanced/ongoing HEP to improve outcomes and carryover.  Baseline:  Goal status: INITIAL  2.  Patient will report 75% improvement in R shoulder pain to improve QOL. (<3/10) Baseline: 6-7/10 pain Goal status: INITIAL  3.  Patient will be able to do overhead work and reaching across his body without pain provocation to allow for increased ease of ADLs.  Baseline:  Goal status: INITIAL  4.  Patient will demonstrate improved functional UE strength as demonstrated by 5/5. Baseline:  Goal status: INITIAL  5.  Patient will  report 10 points improvement on QuickDash to demonstrate improved functional ability.  Baseline: 29.5 Goal status: INITIAL   PLAN:  PT FREQUENCY: 1x/week  PT DURATION: 12 weeks  PLANNED INTERVENTIONS: 97110-Therapeutic exercises, 97530- Therapeutic activity, 97112- Neuromuscular re-education, 97535- Self Care, 16109- Manual therapy, G0283- Electrical stimulation (unattended), 97016- Vasopneumatic device, [id number]- Ultrasound, [id number]- Ionotophoresis 4mg /ml Dexamethasone , 60454 (1-2 muscles), 20561 (3+ muscles)- Dry Needling, Patient/Family education, Taping, Joint mobilization, Joint manipulation, Spinal manipulation, Spinal mobilization, Cryotherapy, and Moist heat  PLAN FOR NEXT SESSION: R shoulder strengthening and for pec, could try ionto or taping    Donavon Fudge, PT 04/16/2024, 10:51 AM

## 2024-04-16 ENCOUNTER — Ambulatory Visit: Attending: Urgent Care

## 2024-04-16 DIAGNOSIS — G8929 Other chronic pain: Secondary | ICD-10-CM | POA: Diagnosis not present

## 2024-04-16 DIAGNOSIS — M25511 Pain in right shoulder: Secondary | ICD-10-CM | POA: Diagnosis not present

## 2024-04-16 DIAGNOSIS — M6281 Muscle weakness (generalized): Secondary | ICD-10-CM | POA: Diagnosis not present

## 2024-05-01 ENCOUNTER — Ambulatory Visit: Admitting: Physical Therapy

## 2024-05-15 ENCOUNTER — Ambulatory Visit

## 2024-05-29 ENCOUNTER — Ambulatory Visit: Admitting: Physical Therapy

## 2024-08-08 ENCOUNTER — Other Ambulatory Visit: Payer: Self-pay | Admitting: Urgent Care

## 2024-08-08 DIAGNOSIS — E782 Mixed hyperlipidemia: Secondary | ICD-10-CM

## 2024-09-14 ENCOUNTER — Other Ambulatory Visit: Payer: Self-pay | Admitting: Urgent Care

## 2024-09-14 DIAGNOSIS — E782 Mixed hyperlipidemia: Secondary | ICD-10-CM

## 2024-09-14 DIAGNOSIS — E890 Postprocedural hypothyroidism: Secondary | ICD-10-CM

## 2024-09-14 NOTE — Telephone Encounter (Unsigned)
 Copied from CRM [id number]. Topic: Clinical - Medication Refill >> Sep 14, 2024  4:53 PM Teressa P wrote: Medication: Pravastatin  20g Levothyroxine  50 mcg  Has the patient contacted their pharmacy? Yes  This is the patient's preferred pharmacy:  [id number] - [place], [place] - [address] AT [facility] [address] [place] [address] Phone: [phone] Fax: [phone]  Is this the correct pharmacy for this prescription? Yes If no, delete pharmacy and type the correct one.   Has the prescription been filled recently? Yes  Is the patient out of the medication? Yes  Has the patient been seen for an appointment in the last year OR does the patient have an upcoming appointment? Yes  Can we respond through MyChart? No  Agent: Please be advised that Rx refills may take up to 3 business days. We ask that you follow-up with your pharmacy.

## 2024-09-15 MED ORDER — PRAVASTATIN SODIUM 20 MG PO TABS: 20.0000 mg | ORAL_TABLET | Freq: Every day | ORAL | 0 refills | Status: DC

## 2024-09-15 MED ORDER — LEVOTHYROXINE SODIUM 50 MCG PO TABS: 50.0000 ug | ORAL_TABLET | Freq: Every day | ORAL | 0 refills | Status: DC

## 2024-09-15 NOTE — Telephone Encounter (Signed)
 Can you please schedule pt a follow up visit?  I am willing to call in 30 days to prevent running out.[email or web address]

## 2024-09-15 NOTE — Telephone Encounter (Signed)
 30 day supply of pravastatin  and levothyroxine  sent per provider.  Patient will return call and ask for kim for scheduling.

## 2024-09-15 NOTE — Telephone Encounter (Signed)
 Spoke with patient briefly - he was loading cows onto a trailer at the time of the call and requested that he be able to call back to schedule this appointment.  He does state he is out of his medication and the 30 day supply would be helpful until he could be seen .

## 2024-09-15 NOTE — Telephone Encounter (Signed)
 Requesting rx rf of  Pravastatin  20mg  last written 02/05/2024 whitney crain Last Lipid panel 02/04/2024 And  Levothyroxine  last written 04/09/2024 Rachell Gave Last TSH , T4 and T3  02/04/2024 Last OV 03/16/2024 [place] location [facility] Upcoming appt = none  Will patient need to schedule a visit for TOC before refills sent?

## 2024-10-05 ENCOUNTER — Ambulatory Visit: Admitting: Urgent Care

## 2024-10-05 ENCOUNTER — Encounter: Payer: Self-pay | Admitting: Urgent Care

## 2024-10-05 VITALS — BP 132/81 | HR 74 | Ht 69.0 in | Wt 183.0 lb

## 2024-10-05 DIAGNOSIS — E890 Postprocedural hypothyroidism: Secondary | ICD-10-CM

## 2024-10-05 DIAGNOSIS — M25511 Pain in right shoulder: Secondary | ICD-10-CM | POA: Diagnosis not present

## 2024-10-05 DIAGNOSIS — I10 Essential (primary) hypertension: Secondary | ICD-10-CM | POA: Diagnosis not present

## 2024-10-05 DIAGNOSIS — E782 Mixed hyperlipidemia: Secondary | ICD-10-CM

## 2024-10-05 DIAGNOSIS — G8929 Other chronic pain: Secondary | ICD-10-CM

## 2024-10-05 DIAGNOSIS — M19011 Primary osteoarthritis, right shoulder: Secondary | ICD-10-CM | POA: Diagnosis not present

## 2024-10-05 DIAGNOSIS — E538 Deficiency of other specified B group vitamins: Secondary | ICD-10-CM | POA: Diagnosis not present

## 2024-10-05 MED ORDER — LISINOPRIL-HYDROCHLOROTHIAZIDE 10-12.5 MG PO TABS: 1.0000 | ORAL_TABLET | Freq: Every day | ORAL | 3 refills | Status: AC

## 2024-10-05 MED ORDER — LEVOTHYROXINE SODIUM 50 MCG PO TABS: 50.0000 ug | ORAL_TABLET | Freq: Every day | ORAL | 1 refills | Status: AC

## 2024-10-05 MED ORDER — PRAVASTATIN SODIUM 20 MG PO TABS: 20.0000 mg | ORAL_TABLET | Freq: Every day | ORAL | 1 refills | Status: AC

## 2024-10-05 NOTE — Patient Instructions (Signed)
 Please continue all meds as ordered, however  Take levothyroxine  50mcg every morning, 30 minutes prior to food or other pills.  Take pravastatin  every evening before bed.  Continue lisinopril -hydrochlorothiazide  every morning 30 minutes after your thyroid  pill.  Please return for your annual physical in 4-6 months.

## 2024-10-05 NOTE — Progress Notes (Signed)
 Established Patient Office Visit  Subjective:  Patient ID: Stephen Baker, male    DOB: November 16, 1965  Age: 58 y.o. MRN: [id number]  Chief Complaint  Patient presents with   Hypertension    58yo male presents for follow up. Hx of HTN, HLD, B12 deficiency and hypothyroid. Taking levothyroxine  in AM, but with HTN meds and after eating. Tolerating BP meds well without ADRs. BP stable and controlled. No additional episodes of dizziness. He does have chronic R shoulder pain around the [facility] joint, persistent daily. MRI 11 years ago shows advanced OA to the [facility] joint. Pt using topical diclofenac  gel intermittently with decent results. Denies need for PO medications at this time. He is taking his pravastatin  nightly and daily SL B12 OTC.  Hypertension      Patient Active Problem List   Diagnosis Date Noted   Postablative hypothyroidism 03/16/2024   Chronic right shoulder pain 03/16/2024   B12 deficiency 03/16/2024   Mixed hyperlipidemia 03/16/2024   Dizziness 12/03/2023   History of GI bleed 12/03/2023   Hypertension 12/03/2023   Prostate cancer screening 12/03/2023   Personal history of hyperthyroidism 12/03/2023   H/O ulcer disease 12/03/2023   GI bleed 09/07/2016   Syncope 09/07/2016   Past Medical History:  Diagnosis Date   GERD (gastroesophageal reflux disease)    GI bleed    Hypertension    Kidney stones    Medical history non-contributory    Personal history of kidney stones    Thyroid  disease    hyper   Thyroid  disease    hyperactive   Ulcer    almost 20 years ago   Past Surgical History:  Procedure Laterality Date   bleeding stomach ulcer     2005 or 2006   ESOPHAGOGASTRODUODENOSCOPY (EGD) WITH PROPOFOL  N/A 09/07/2016   Procedure: ESOPHAGOGASTRODUODENOSCOPY (EGD) WITH PROPOFOL ;  Surgeon: Lamar ONEIDA Holmes, MD;  Location: [facility] ENDOSCOPY;  Service: Endoscopy;  Laterality: N/A;   OTHER SURGICAL HISTORY     SHOULDER ARTHROSCOPY WITH ROTATOR CUFF REPAIR AND SUBACROMIAL  DECOMPRESSION Right 10/08/2013   Procedure: RIGHT SHOULDER ARTHROSCOPY WITH SUBACROMIAL DECOMPRESSION/DISTAL CLAVICLE RESECTION ;  Surgeon: Lamar LULLA Leonor Mickey., MD;  Location: [facility];  Service: Orthopedics;  Laterality: Right;   SHOULDER SURGERY Right    WISDOM TOOTH EXTRACTION     Social History   Tobacco Use   Smoking status: Never    Passive exposure: Never   Smokeless tobacco: Never  Vaping Use   Vaping status: Never Used  Substance Use Topics   Alcohol use: Yes    Alcohol/week: 10.0 standard drinks of alcohol    Types: 10 Cans of beer per week    Comment: 1 beer per day   Drug use: Never      ROS: as noted in HPI  Objective:     BP 132/81   Pulse 74   Ht 5' 9 (1.753 m)   Wt 183 lb (83 kg)   SpO2 97%   BMI 27.02 kg/m  BP Readings from Last 3 Encounters:  10/05/24 132/81  03/16/24 (!) 138/92  12/17/23 (!) 134/93   Wt Readings from Last 3 Encounters:  10/05/24 183 lb (83 kg)  03/16/24 182 lb 3.2 oz (82.6 kg)  12/17/23 185 lb (83.9 kg)      Physical Exam Vitals and nursing note reviewed.  Constitutional:      General: He is not in acute distress.    Appearance: Normal appearance. He is not ill-appearing, toxic-appearing or  diaphoretic.  HENT:     Head: Normocephalic and atraumatic.     Right Ear: Tympanic membrane, ear canal and external ear normal. There is no impacted cerumen.     Left Ear: Tympanic membrane, ear canal and external ear normal. There is no impacted cerumen.     Nose: Nose normal.     Mouth/Throat:     Mouth: Mucous membranes are moist.     Pharynx: Oropharynx is clear. No oropharyngeal exudate or posterior oropharyngeal erythema.  Eyes:     General: No scleral icterus.       Right eye: No discharge.        Left eye: No discharge.     Extraocular Movements: Extraocular movements intact.     Pupils: Pupils are equal, round, and reactive to light.  Neck:     Thyroid : No thyroid  mass, thyromegaly or thyroid   tenderness.  Cardiovascular:     Rate and Rhythm: Normal rate and regular rhythm.     Pulses: Normal pulses.     Heart sounds: No murmur heard. Pulmonary:     Effort: Pulmonary effort is normal. No respiratory distress.     Breath sounds: Normal breath sounds. No stridor. No wheezing or rhonchi.  Musculoskeletal:     Cervical back: Normal range of motion and neck supple. No rigidity or tenderness.  Lymphadenopathy:     Cervical: No cervical adenopathy.  Skin:    General: Skin is warm and dry.     Coloration: Skin is not jaundiced.     Findings: No bruising, erythema or rash.  Neurological:     General: No focal deficit present.     Mental Status: He is alert and oriented to person, place, and time.     Sensory: No sensory deficit.     Motor: No weakness.  Psychiatric:        Mood and Affect: Mood normal.        Behavior: Behavior normal.      No results found for any visits on 10/05/24.  Last CBC Lab Results  Component Value Date   WBC 7.0 12/03/2023   HGB 16.8 12/03/2023   HCT 50.7 12/03/2023   MCV 96.7 12/03/2023   MCH 27.2 11/12/2016   RDW 12.8 12/03/2023   PLT 324.0 12/03/2023   Last metabolic panel Lab Results  Component Value Date   GLUCOSE 93 12/03/2023   NA 137 12/03/2023   K 5.0 12/03/2023   CL 100 12/03/2023   CO2 29 12/03/2023   BUN 14 12/03/2023   CREATININE 1.10 12/03/2023   GFR 74.98 12/03/2023   CALCIUM 9.7 12/03/2023   PROT 7.3 12/03/2023   ALBUMIN 5.0 12/03/2023   BILITOT 0.5 12/03/2023   ALKPHOS 59 12/03/2023   AST 21 12/03/2023   ALT 15 12/03/2023   ANIONGAP 7 11/12/2016   Last lipids Lab Results  Component Value Date   CHOL 234 (H) 02/04/2024   HDL 56.90 02/04/2024   LDLCALC 161 (H) 02/04/2024   TRIG 79.0 02/04/2024   CHOLHDL 4 02/04/2024   Last hemoglobin A1c Lab Results  Component Value Date   HGBA1C 5.4 12/03/2023   HGBA1C 5.4 12/03/2023   HGBA1C 5.4 (A) 12/03/2023   HGBA1C 5.4 12/03/2023   Last thyroid   functions Lab Results  Component Value Date   TSH 5.81 (H) 02/04/2024   T3TOTAL 100 02/04/2024   FREET4 0.84 02/04/2024   Last vitamin D No results found for: 25OHVITD2, 25OHVITD3, VD25OH Last vitamin B12 and Folate Lab Results  Component Value Date   VITAMINB12 375 02/04/2024      The 10-year ASCVD risk score (Arnett DK, et al., 2019) is: 8.7%  Assessment & Plan:  Mixed hyperlipidemia -     Lipid panel -     Pravastatin  Sodium; Take 1 tablet (20 mg total) by mouth at bedtime.  Dispense: 90 tablet; Refill: 1  Hypertension, unspecified type -     Lisinopril -hydroCHLOROthiazide ; Take 1 tablet by mouth daily.  Dispense: 90 tablet; Refill: 3 -     CMP14+EGFR -     CBC with Differential/Platelet  Postablative hypothyroidism -     TSH + free T4 -     Levothyroxine  Sodium; Take 1 tablet (50 mcg total) by mouth daily before breakfast.  Dispense: 90 tablet; Refill: 1  B12 deficiency -     B12 and Folate Panel  Chronic right shoulder pain  Osteoarthritis of right acromioclavicular joint  Medications refilled, labs obtained today. Pt had oatmeal for breakfast. No change to POC.    Return in about 4 months (around 02/03/2025) for Annual Physical.   Benton LITTIE Gave, PA

## 2024-10-06 LAB — TSH+FREE T4
Free T4: 1.17 ng/dL (ref 0.82–1.77)
TSH: 4.71 u[IU]/mL — ABNORMAL HIGH (ref 0.450–4.500)

## 2024-10-06 LAB — CBC WITH DIFFERENTIAL/PLATELET
Basophils Absolute: 0.1 x10E3/uL (ref 0.0–0.2)
Basos: 1 %
EOS (ABSOLUTE): 0.2 x10E3/uL (ref 0.0–0.4)
Eos: 4 %
Hematocrit: 46 % (ref 37.5–51.0)
Hemoglobin: 15.6 g/dL (ref 13.0–17.7)
Immature Grans (Abs): 0 x10E3/uL (ref 0.0–0.1)
Immature Granulocytes: 0 %
Lymphocytes Absolute: 1.3 x10E3/uL (ref 0.7–3.1)
Lymphs: 26 %
MCH: 31.8 pg (ref 26.6–33.0)
MCHC: 33.9 g/dL (ref 31.5–35.7)
MCV: 94 fL (ref 79–97)
Monocytes Absolute: 0.4 x10E3/uL (ref 0.1–0.9)
Monocytes: 9 %
Neutrophils Absolute: 3.1 x10E3/uL (ref 1.4–7.0)
Neutrophils: 59 %
Platelets: 314 x10E3/uL (ref 150–450)
RBC: 4.9 x10E6/uL (ref 4.14–5.80)
RDW: 11.9 % (ref 11.6–15.4)
WBC: 5.2 x10E3/uL (ref 3.4–10.8)

## 2024-10-06 LAB — CMP14+EGFR
ALT: 16 IU/L (ref 0–44)
AST: 22 IU/L (ref 0–40)
Albumin: 4.7 g/dL (ref 3.8–4.9)
Alkaline Phosphatase: 74 IU/L (ref 47–123)
BUN/Creatinine Ratio: 14 (ref 9–20)
BUN: 15 mg/dL (ref 6–24)
Bilirubin Total: 0.3 mg/dL (ref 0.0–1.2)
CO2: 22 mmol/L (ref 20–29)
Calcium: 9.7 mg/dL (ref 8.7–10.2)
Chloride: 101 mmol/L (ref 96–106)
Creatinine, Ser: 1.08 mg/dL (ref 0.76–1.27)
Globulin, Total: 2.3 g/dL (ref 1.5–4.5)
Glucose: 95 mg/dL (ref 70–99)
Potassium: 4.6 mmol/L (ref 3.5–5.2)
Sodium: 140 mmol/L (ref 134–144)
Total Protein: 7 g/dL (ref 6.0–8.5)
eGFR: 80 mL/min/1.73 (ref >59)

## 2024-10-06 LAB — LIPID PANEL
Chol/HDL Ratio: 3.6 ratio (ref 0.0–5.0)
Cholesterol, Total: 204 mg/dL — ABNORMAL HIGH (ref 100–199)
HDL: 57 mg/dL (ref >39)
LDL Chol Calc (NIH): 125 mg/dL — ABNORMAL HIGH (ref 0–99)
Triglycerides: 122 mg/dL (ref 0–149)
VLDL Cholesterol Cal: 22 mg/dL (ref 5–40)

## 2024-10-06 LAB — B12 AND FOLATE PANEL
Folate: 10.6 ng/mL (ref >3.0)
Vitamin B-12: 731 pg/mL (ref 232–1245)

## 2024-10-07 ENCOUNTER — Ambulatory Visit: Payer: Self-pay | Admitting: Urgent Care

## 2025-02-12 ENCOUNTER — Encounter: Admitting: Urgent Care
